# Patient Record
Sex: Female | Born: 1980 | Race: Black or African American | Hispanic: No | Marital: Single | Smoking: Never smoker
Health system: Southern US, Community
[De-identification: ages and names within clinical notes are randomized; demographics above are authoritative.]

## PROBLEM LIST (undated history)

## (undated) HISTORY — PX: OTHER SURGICAL HISTORY: SHX169

---

## 1999-11-03 ENCOUNTER — Emergency Department (HOSPITAL_COMMUNITY): Admission: EM | Admit: 1999-11-03 | Discharge: 1999-11-03 | Payer: Self-pay | Admitting: Emergency Medicine

## 1999-11-03 ENCOUNTER — Encounter: Payer: Self-pay | Admitting: Emergency Medicine

## 2001-06-17 ENCOUNTER — Emergency Department (HOSPITAL_COMMUNITY): Admission: EM | Admit: 2001-06-17 | Discharge: 2001-06-17 | Payer: Self-pay | Admitting: Emergency Medicine

## 2001-06-17 ENCOUNTER — Encounter: Payer: Self-pay | Admitting: Emergency Medicine

## 2002-03-22 ENCOUNTER — Emergency Department (HOSPITAL_COMMUNITY): Admission: EM | Admit: 2002-03-22 | Discharge: 2002-03-22 | Payer: Self-pay | Admitting: Emergency Medicine

## 2003-07-10 ENCOUNTER — Emergency Department (HOSPITAL_COMMUNITY): Admission: EM | Admit: 2003-07-10 | Discharge: 2003-07-10 | Payer: Self-pay | Admitting: Emergency Medicine

## 2005-03-13 ENCOUNTER — Other Ambulatory Visit: Admission: RE | Admit: 2005-03-13 | Discharge: 2005-03-13 | Payer: Self-pay | Admitting: Gynecology

## 2005-03-14 ENCOUNTER — Encounter: Admission: RE | Admit: 2005-03-14 | Discharge: 2005-03-14 | Payer: Self-pay | Admitting: Gynecology

## 2005-06-20 ENCOUNTER — Emergency Department (HOSPITAL_COMMUNITY): Admission: EM | Admit: 2005-06-20 | Discharge: 2005-06-20 | Payer: Self-pay | Admitting: Emergency Medicine

## 2005-06-28 ENCOUNTER — Encounter: Admission: RE | Admit: 2005-06-28 | Discharge: 2005-06-28 | Payer: Self-pay | Admitting: Specialist

## 2006-02-12 ENCOUNTER — Other Ambulatory Visit: Admission: RE | Admit: 2006-02-12 | Discharge: 2006-02-12 | Payer: Self-pay | Admitting: Gynecology

## 2006-05-30 ENCOUNTER — Emergency Department (HOSPITAL_COMMUNITY): Admission: EM | Admit: 2006-05-30 | Discharge: 2006-05-30 | Payer: Self-pay | Admitting: Emergency Medicine

## 2007-02-18 ENCOUNTER — Other Ambulatory Visit: Admission: RE | Admit: 2007-02-18 | Discharge: 2007-02-18 | Payer: Self-pay | Admitting: Gynecology

## 2007-08-27 ENCOUNTER — Other Ambulatory Visit: Admission: RE | Admit: 2007-08-27 | Discharge: 2007-08-27 | Payer: Self-pay | Admitting: Gynecology

## 2008-02-21 ENCOUNTER — Emergency Department (HOSPITAL_COMMUNITY): Admission: EM | Admit: 2008-02-21 | Discharge: 2008-02-22 | Payer: Self-pay | Admitting: Emergency Medicine

## 2008-06-10 ENCOUNTER — Emergency Department (HOSPITAL_COMMUNITY): Admission: EM | Admit: 2008-06-10 | Discharge: 2008-06-10 | Payer: Self-pay | Admitting: Emergency Medicine

## 2009-03-06 ENCOUNTER — Emergency Department (HOSPITAL_COMMUNITY): Admission: EM | Admit: 2009-03-06 | Discharge: 2009-03-06 | Payer: Self-pay | Admitting: Emergency Medicine

## 2010-05-28 ENCOUNTER — Emergency Department (HOSPITAL_COMMUNITY)
Admission: EM | Admit: 2010-05-28 | Discharge: 2010-05-28 | Disposition: A | Discharge status: Home / Self Care | Payer: No Typology Code available for payment source | Attending: Emergency Medicine | Admitting: Emergency Medicine

## 2010-05-28 DIAGNOSIS — IMO0001 Reserved for inherently not codable concepts without codable children: Secondary | ICD-10-CM | POA: Insufficient documentation

## 2010-05-28 DIAGNOSIS — R509 Fever, unspecified: Secondary | ICD-10-CM | POA: Insufficient documentation

## 2010-05-28 DIAGNOSIS — R059 Cough, unspecified: Secondary | ICD-10-CM | POA: Insufficient documentation

## 2010-05-28 DIAGNOSIS — R5383 Other fatigue: Secondary | ICD-10-CM | POA: Insufficient documentation

## 2010-05-28 DIAGNOSIS — R5381 Other malaise: Secondary | ICD-10-CM | POA: Insufficient documentation

## 2010-05-28 DIAGNOSIS — R05 Cough: Secondary | ICD-10-CM | POA: Insufficient documentation

## 2010-05-28 DIAGNOSIS — B9789 Other viral agents as the cause of diseases classified elsewhere: Secondary | ICD-10-CM | POA: Insufficient documentation

## 2010-05-28 LAB — URINALYSIS, ROUTINE W REFLEX MICROSCOPIC
Glucose, UA: NEGATIVE mg/dL
pH: 7.5 (ref 5.0–8.0)

## 2010-05-28 LAB — URINE MICROSCOPIC-ADD ON

## 2012-01-28 ENCOUNTER — Encounter (HOSPITAL_COMMUNITY): Payer: Self-pay | Admitting: *Deleted

## 2012-01-28 ENCOUNTER — Emergency Department (HOSPITAL_COMMUNITY)
Admission: EM | Admit: 2012-01-28 | Discharge: 2012-01-28 | Disposition: A | Discharge status: Home / Self Care | Payer: Self-pay | Attending: Emergency Medicine | Admitting: Emergency Medicine

## 2012-01-28 ENCOUNTER — Ambulatory Visit (HOSPITAL_COMMUNITY): Payer: Self-pay

## 2012-01-28 DIAGNOSIS — R51 Headache: Secondary | ICD-10-CM

## 2012-01-28 NOTE — ED Provider Notes (Signed)
History     CSN: [id number]  Arrival date & time 01/28/12  1242   First MD Initiated Contact with Patient 01/28/12 1356      Chief Complaint  Patient presents with  . Headache    (Consider location/radiation/quality/duration/timing/severity/associated sxs/prior treatment) HPI Comments: Patient presents today with a chief complaint of headache.  She reports that the headache has been intermittent for the past 4 days.  Headache located at the posterior portion of her head.  She has been taking Ibuprofen for the pain, which helps somewhat.  No prior history of migraines.  She reports that she does not typically have headaches.  She reports that she has never had a headache like before.  She is concerned that she may have an aneurysm.   She denies nausea, vomiting, or vision changes.  No neck pain or stiffness.    Patient is a 31 y.o. female presenting with headaches. The history is provided by the patient.  Headache  The headache is associated with nothing. The pain is located in the occipital region. The quality of the pain is described as throbbing. The pain does not radiate. Pertinent negatives include no fever, no near-syncope, no syncope, no nausea and no vomiting.    History reviewed. No pertinent past medical history.  History reviewed. No pertinent past surgical history.  History reviewed. No pertinent family history.  History  Substance Use Topics  . Smoking status: Never Smoker   . Smokeless tobacco: Not on file  . Alcohol Use: No    OB History    Grav Para Term Preterm Abortions TAB SAB Ect Mult Living                  Review of Systems  Constitutional: Negative for fever and chills.  HENT: Negative for neck pain and neck stiffness.   Eyes: Negative for visual disturbance.  Cardiovascular: Negative for syncope and near-syncope.  Gastrointestinal: Negative for nausea and vomiting.  Skin: Negative for wound.  Neurological: Positive for headaches. Negative for  dizziness, seizures, syncope, weakness, light-headedness and numbness.  Hematological: Does not bruise/bleed easily.  Psychiatric/Behavioral: Negative for confusion.    Allergies  Review of patient's allergies indicates no known allergies.  Home Medications   Current Outpatient Rx  Name  Route  Sig  Dispense  Refill  . IBUPROFEN 200 MG PO TABS   Oral   Take 200 mg by mouth every 6 (six) hours as needed. Pain           BP 103/88  Pulse 99  Temp 98.2 F (36.8 C) (Oral)  Resp 16  SpO2 100%  Physical Exam  Nursing note and vitals reviewed. Constitutional: She appears well-developed and well-nourished. No distress.  HENT:  Head: Normocephalic and atraumatic.  Mouth/Throat: Oropharynx is clear and moist.  Eyes: EOM are normal. Pupils are equal, round, and reactive to light.  Neck: Normal range of motion. Neck supple.  Cardiovascular: Normal rate, regular rhythm and normal heart sounds.   Pulmonary/Chest: Effort normal and breath sounds normal.  Neurological: She is alert. She has normal strength. No cranial nerve deficit or sensory deficit. Coordination and gait normal.  Skin: Skin is warm and dry. She is not diaphoretic.    ED Course  Procedures (including critical care time)  Labs Reviewed - No data to display Ct Head Wo Contrast  01/28/2012  *RADIOLOGY REPORT*  Clinical Data: Headache for 4 days  CT HEAD WITHOUT CONTRAST  Technique:  Contiguous axial images were obtained from  the base of the skull through the vertex without contrast.  Comparison: None.  Findings: The brain has a normal appearance without evidence of malformation, atrophy, old or acute infarction, mass lesion, hemorrhage, hydrocephalus or extra-axial collection.  The calvarium is unremarkable.  Visualized sinuses, middle ears and mastoids are clear.  IMPRESSION: Normal head CT   Original Report Authenticated By: Paulina Fusi, M.D.      1. Headache       MDM  Patient presenting with a chief  complaint of headache.  She reports that she doesn't normally have headaches and has never had a headache like this before.  Patient is afebrile.  No stiffness of the neck.  Normal neurological exam.  Head CT negative.  Therefore, feel that patient can be discharged home.  Patient instructed to follow up with PCP.        Pascal Lux [place], PA-C 01/28/12 [phone]

## 2012-01-28 NOTE — ED Notes (Signed)
Pt reports headache x3-4 days. Pain radiates from back of neck up back of head. Denies blurry vision or dizziness. Reports intermittent relief of pain from iburprofen, but pain returns.

## 2012-01-29 NOTE — ED Provider Notes (Signed)
Medical screening examination/treatment/procedure(s) were performed by non-physician practitioner and as supervising physician I was immediately available for consultation/collaboration.  Toy Baker, MD 01/29/12 [phone]

## 2014-04-09 IMAGING — CT CT HEAD W/O CM
2 series · 16 of 30 positions shown, 20 images · non-contrast
Comparison: None.

CLINICAL DATA: Headache for 4 days

CT HEAD WITHOUT CONTRAST
TECHNIQUE: Contiguous axial images were obtained from the base of
the skull through the vertex without contrast.

[Series 2: head w/o · axial · non-contrast · 0.43mm/px · z∈[-163,-43]mm · 13 of 29 slices shown, 17 images]
[im 3/29  brain]
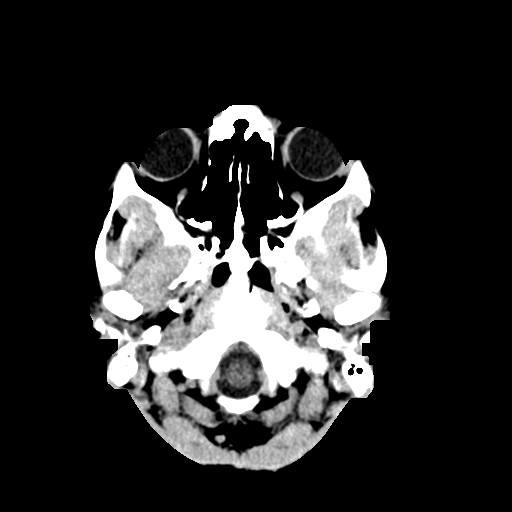
[im 3/29  bone]
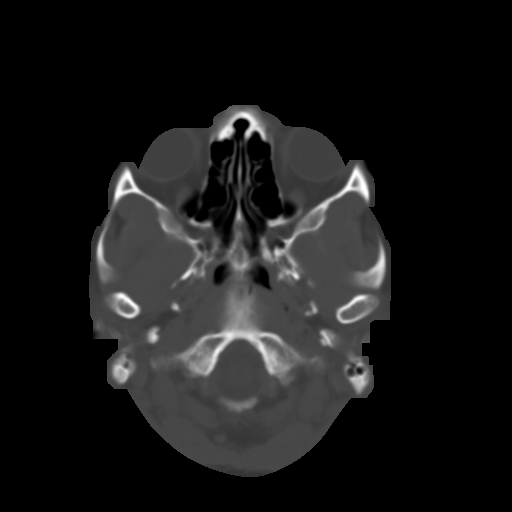
[im 5/29  brain]
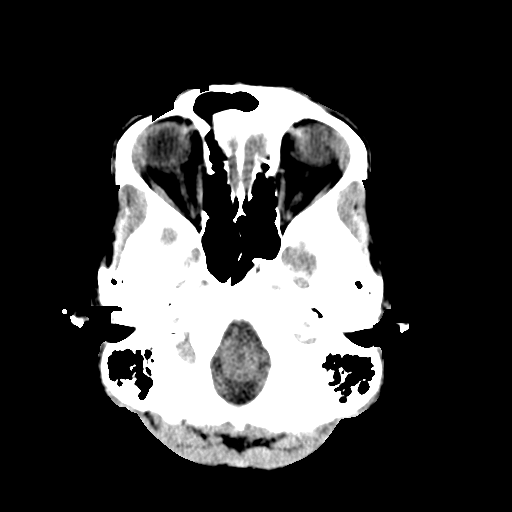
[im 7/29  brain]
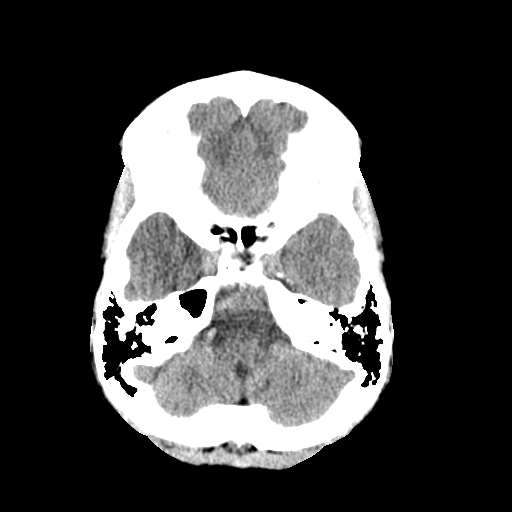
[im 9/29  brain]
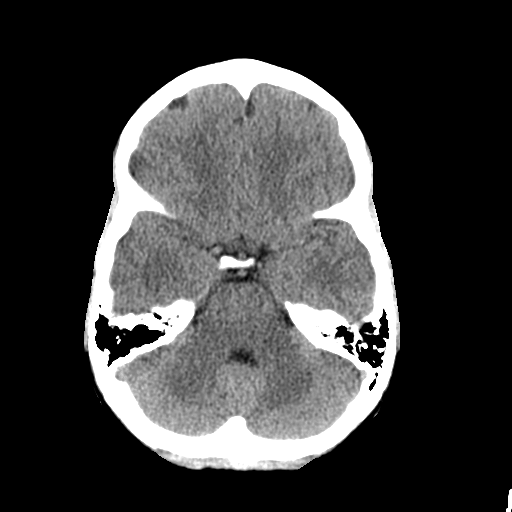
[im 11/29  brain]
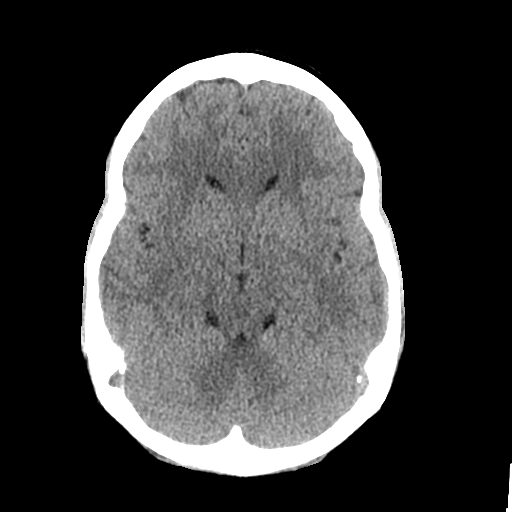
[im 11/29  bone]
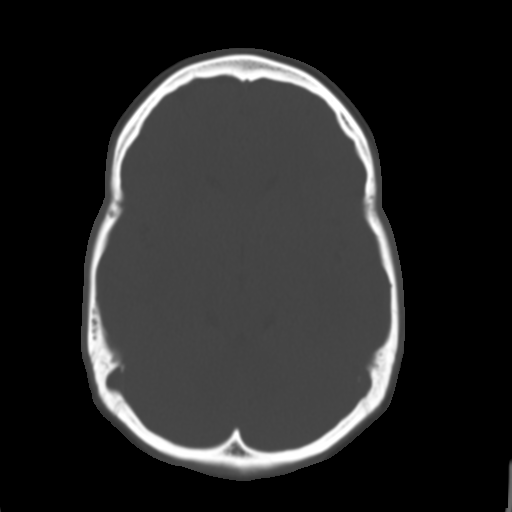
[im 13/29  brain]
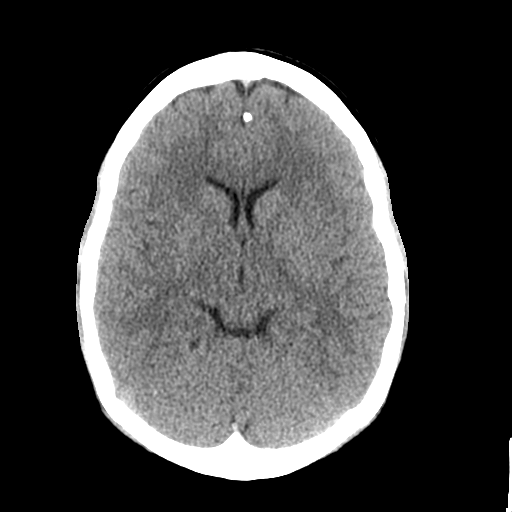
[im 15/29  brain]
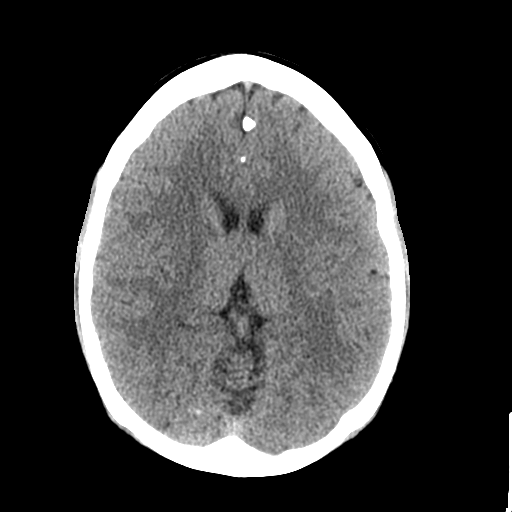
[im 17/29  brain]
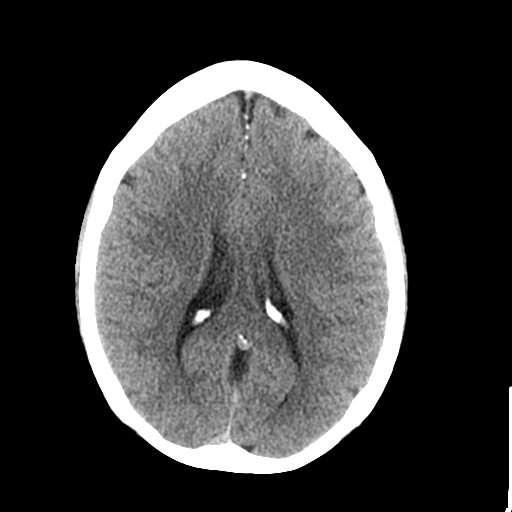
[im 19/29  brain]
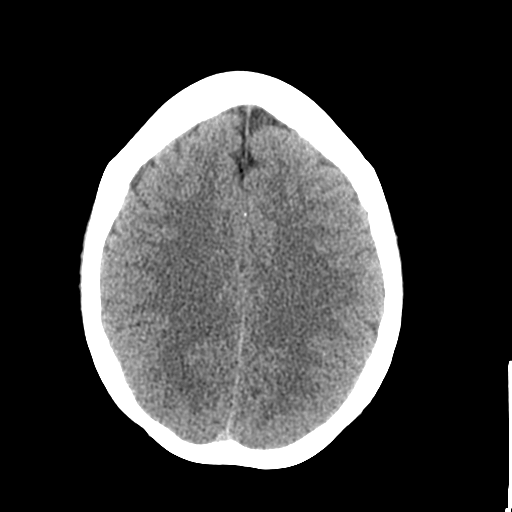
[im 19/29  bone]
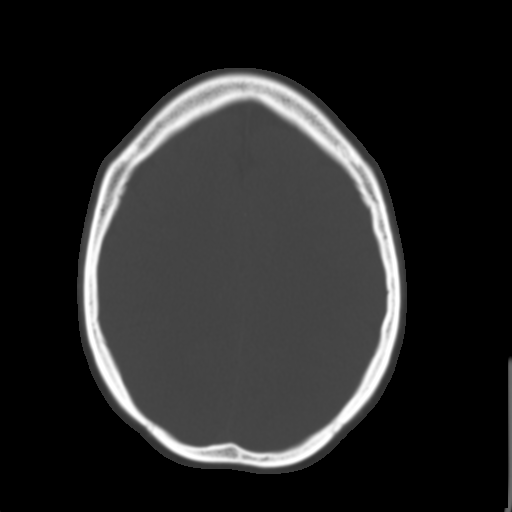
[im 21/29  brain]
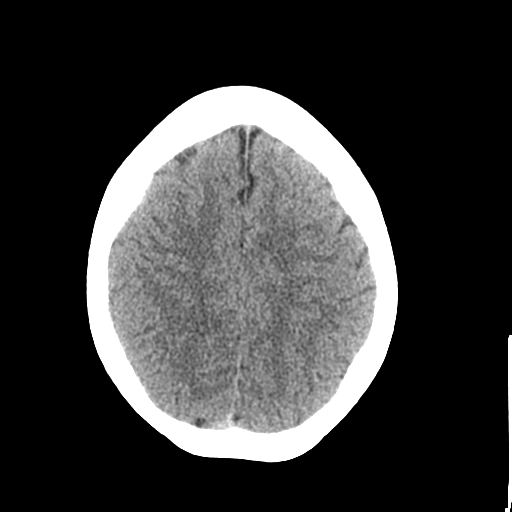
[im 23/29  brain]
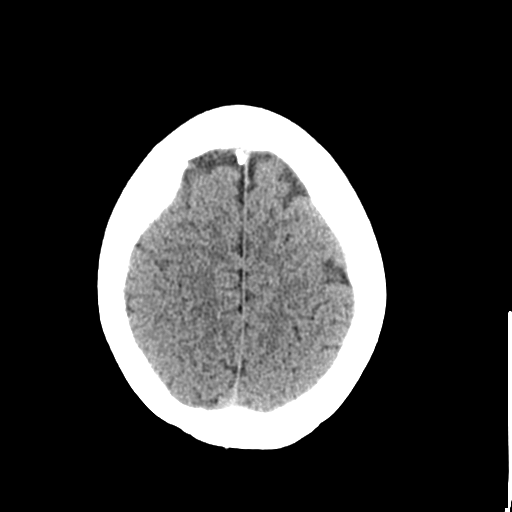
[im 25/29  brain]
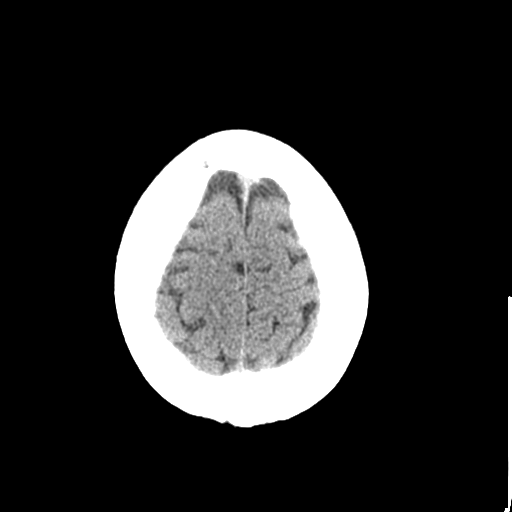
[im 27/29  brain]
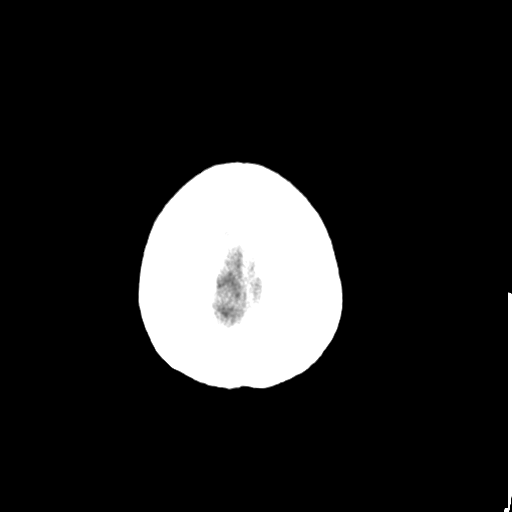
[im 27/29  bone]
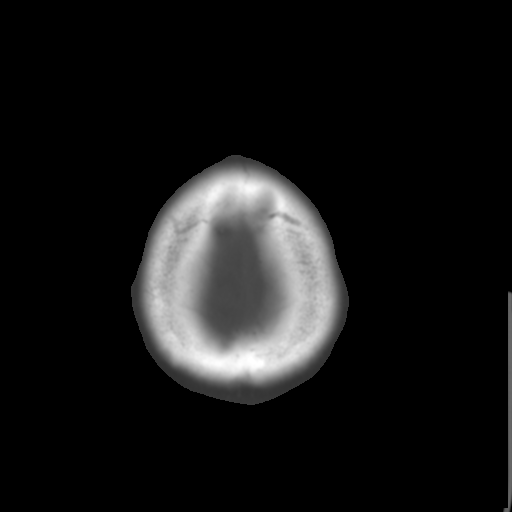

[Series 3: bone windows · axial · 0.43mm/px · z∈[-163,-123]mm · 3 of 29 slices shown]
[im 3/29  bone]
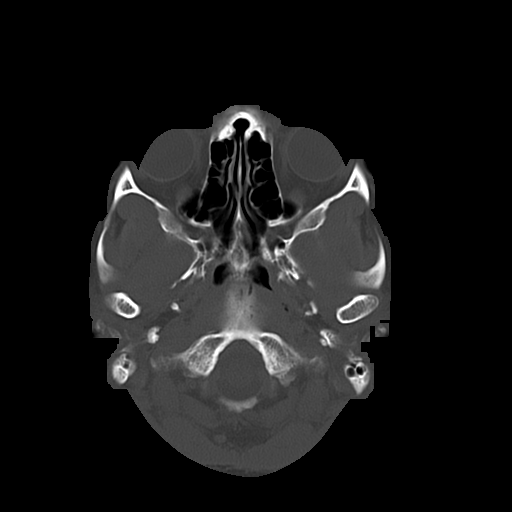
[im 7/29  bone]
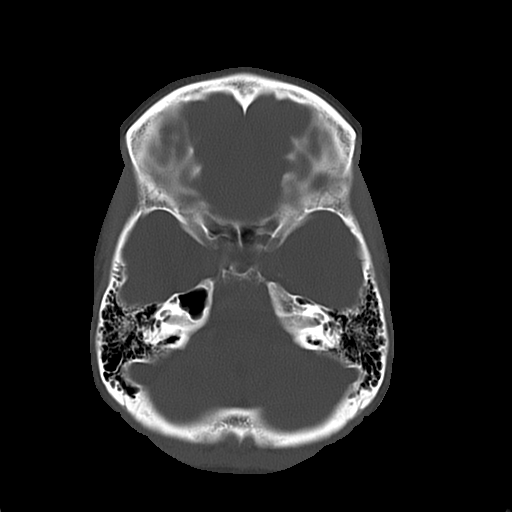
[im 11/29  bone]
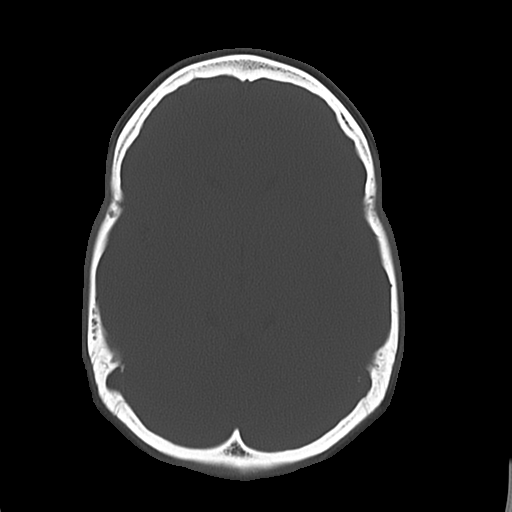

[16 of 30 positions shown; findings below may reference images not displayed]

FINDINGS: The brain has a normal appearance without evidence of
malformation, atrophy, old or acute infarction, mass lesion,
hemorrhage, hydrocephalus or extra-axial collection.  The calvarium
is unremarkable.  Visualized sinuses, middle ears and mastoids are
clear.
IMPRESSION: Normal head CT

## 2014-12-27 ENCOUNTER — Emergency Department (HOSPITAL_COMMUNITY)
Admission: EM | Admit: 2014-12-27 | Discharge: 2014-12-27 | Disposition: A | Discharge status: Home / Self Care | Payer: 59 | Attending: Emergency Medicine | Admitting: Emergency Medicine

## 2014-12-27 ENCOUNTER — Encounter (HOSPITAL_COMMUNITY): Payer: Self-pay | Admitting: Emergency Medicine

## 2014-12-27 DIAGNOSIS — R52 Pain, unspecified: Secondary | ICD-10-CM | POA: Diagnosis not present

## 2014-12-27 DIAGNOSIS — R42 Dizziness and giddiness: Secondary | ICD-10-CM | POA: Insufficient documentation

## 2014-12-27 DIAGNOSIS — R6883 Chills (without fever): Secondary | ICD-10-CM | POA: Diagnosis not present

## 2014-12-27 DIAGNOSIS — Z3202 Encounter for pregnancy test, result negative: Secondary | ICD-10-CM | POA: Insufficient documentation

## 2014-12-27 DIAGNOSIS — R197 Diarrhea, unspecified: Secondary | ICD-10-CM | POA: Diagnosis not present

## 2014-12-27 LAB — URINE MICROSCOPIC-ADD ON

## 2014-12-27 LAB — CBC WITH DIFFERENTIAL/PLATELET
BASOS ABS: 0 10*3/uL (ref 0.0–0.1)
BASOS PCT: 0 %
Eosinophils Absolute: 0 10*3/uL (ref 0.0–0.7)
Eosinophils Relative: 0 %
HEMATOCRIT: 45.4 % (ref 36.0–46.0)
Hemoglobin: 15.1 g/dL — ABNORMAL HIGH (ref 12.0–15.0)
Lymphocytes Relative: 5 %
Lymphs Abs: 0.6 10*3/uL — ABNORMAL LOW (ref 0.7–4.0)
MCH: 28.8 pg (ref 26.0–34.0)
MCHC: 33.3 g/dL (ref 30.0–36.0)
MCV: 86.6 fL (ref 78.0–100.0)
MONO ABS: 0.4 10*3/uL (ref 0.1–1.0)
Monocytes Relative: 4 %
NEUTROS ABS: 9.5 10*3/uL — AB (ref 1.7–7.7)
Neutrophils Relative %: 91 %
PLATELETS: 262 10*3/uL (ref 150–400)
RBC: 5.24 MIL/uL — ABNORMAL HIGH (ref 3.87–5.11)
RDW: 13.8 % (ref 11.5–15.5)
WBC: 10.5 10*3/uL (ref 4.0–10.5)

## 2014-12-27 LAB — COMPREHENSIVE METABOLIC PANEL
ALBUMIN: 4.5 g/dL (ref 3.5–5.0)
ALT: 21 U/L (ref 14–54)
ANION GAP: 10 (ref 5–15)
AST: 17 U/L (ref 15–41)
Alkaline Phosphatase: 113 U/L (ref 38–126)
BUN: 6 mg/dL (ref 6–20)
CALCIUM: 9.5 mg/dL (ref 8.9–10.3)
CHLORIDE: 105 mmol/L (ref 101–111)
CO2: 23 mmol/L (ref 22–32)
Creatinine, Ser: 1.09 mg/dL — ABNORMAL HIGH (ref 0.44–1.00)
GFR calc non Af Amer: >60 mL/min (ref >60)
Glucose, Bld: 93 mg/dL (ref 65–99)
Potassium: 4.1 mmol/L (ref 3.5–5.1)
Sodium: 138 mmol/L (ref 135–145)
Total Bilirubin: 0.8 mg/dL (ref 0.3–1.2)
Total Protein: 8.9 g/dL — ABNORMAL HIGH (ref 6.5–8.1)

## 2014-12-27 LAB — URINALYSIS, ROUTINE W REFLEX MICROSCOPIC
Glucose, UA: NEGATIVE mg/dL
Ketones, ur: 15 mg/dL — AB
NITRITE: NEGATIVE
PROTEIN: NEGATIVE mg/dL
SPECIFIC GRAVITY, URINE: 1.024 (ref 1.005–1.030)
pH: 5.5 (ref 5.0–8.0)

## 2014-12-27 LAB — LIPASE, BLOOD: Lipase: 33 U/L (ref 11–51)

## 2014-12-27 LAB — POC URINE PREG, ED: PREG TEST UR: NEGATIVE

## 2014-12-27 MED ORDER — ACETAMINOPHEN 325 MG PO TABS
650.0000 mg | ORAL_TABLET | Freq: Once | ORAL | Status: AC
  Administered 2014-12-27: 650 mg via ORAL
  Filled 2014-12-27: qty 2

## 2014-12-27 MED ORDER — IBUPROFEN 600 MG PO TABS: 600.0000 mg | ORAL_TABLET | Freq: Four times a day (QID) | ORAL | Status: DC | PRN

## 2014-12-27 MED ORDER — SODIUM CHLORIDE 0.9 % IV BOLUS (SEPSIS)
1000.0000 mL | Freq: Once | INTRAVENOUS | Status: AC
  Administered 2014-12-27: 1000 mL via INTRAVENOUS

## 2014-12-27 NOTE — ED Notes (Signed)
Attempted to get a urinalysis.

## 2014-12-27 NOTE — ED Notes (Signed)
Pt sts generalized body aches and diarrhea starting today

## 2014-12-27 NOTE — ED Provider Notes (Signed)
CSN: [id number]     Arrival date & time 12/27/14  1411 History   First MD Initiated Contact with Patient 12/27/14 1833     Chief Complaint  Patient presents with  . Generalized Body Aches    HPI   Vicki Contreras is a 34 y.o. female with no pertinent PMH who presents to the ED with generalized body aches and diarrhea, which she states started this morning. She denies exacerbating factors. She has not tried anything for symptom relief. She denies fever, though reports chills. She denies chest pain, shortness of breath, abdominal pain, nausea, vomiting, hematochezia, melena, dysuria, urgency, frequency. She reports asociated dizziness, now resolved, and decreased appetite throughout the day today. She denies recent travel or antibiotic use.    History reviewed. No pertinent past medical history. History reviewed. No pertinent past surgical history. History reviewed. No pertinent family history. Social History  Substance Use Topics  . Smoking status: Never Smoker   . Smokeless tobacco: None  . Alcohol Use: No   OB History    No data available      Review of Systems  Constitutional: Positive for chills. Negative for fever.  Respiratory: Negative for shortness of breath.   Cardiovascular: Negative for chest pain.  Gastrointestinal: Positive for diarrhea. Negative for nausea, vomiting, abdominal pain and constipation.  Genitourinary: Negative for dysuria, urgency and frequency.  Neurological: Positive for dizziness. Negative for weakness, light-headedness, numbness and headaches.  All other systems reviewed and are negative.     Allergies  Review of patient's allergies indicates no known allergies.  Home Medications   Prior to Admission medications   Medication Sig Start Date End Date Taking? Authorizing Provider  Aspirin-Acetaminophen-Caffeine (GOODY HEADACHE PO) Take 1 packet by mouth daily as needed. For headache   Yes Historical Provider, MD  ibuprofen (ADVIL,MOTRIN) 600  MG tablet Take 1 tablet (600 mg total) by mouth every 6 (six) hours as needed. 12/27/14   Bethaney Oshana C Rossetta Kama, PA-C    BP 104/65 mmHg  Pulse 92  Temp(Src) 99.4 F (37.4 C) (Oral)  Resp 14  SpO2 100% Physical Exam  Constitutional: She is oriented to person, place, and time. She appears well-developed and well-nourished. No distress.  HENT:  Head: Normocephalic and atraumatic.  Right Ear: External ear normal.  Left Ear: External ear normal.  Nose: Nose normal.  Mouth/Throat: Uvula is midline, oropharynx is clear and moist and mucous membranes are normal.  Eyes: Conjunctivae, EOM and lids are normal. Pupils are equal, round, and reactive to light. Right eye exhibits no discharge. Left eye exhibits no discharge. No scleral icterus.  Neck: Normal range of motion. Neck supple.  Cardiovascular: Normal rate, regular rhythm, normal heart sounds, intact distal pulses and normal pulses.   Pulmonary/Chest: Effort normal and breath sounds normal. No respiratory distress. She has no wheezes. She has no rales.  Abdominal: Soft. Normal appearance and bowel sounds are normal. She exhibits no distension and no mass. There is no tenderness. There is no rigidity, no rebound and no guarding.  Musculoskeletal: Normal range of motion. She exhibits no edema or tenderness.  Neurological: She is alert and oriented to person, place, and time. She has normal strength. No sensory deficit.  Skin: Skin is warm, dry and intact. No rash noted. She is not diaphoretic. No erythema. No pallor.  Psychiatric: She has a normal mood and affect. Her speech is normal and behavior is normal.  Nursing note and vitals reviewed.   ED Course  Procedures (including critical care  time)  Labs Review Labs Reviewed  CBC WITH DIFFERENTIAL/PLATELET - Abnormal; Notable for the following:    RBC 5.24 (*)    Hemoglobin 15.1 (*)    Neutro Abs 9.5 (*)    Lymphs Abs 0.6 (*)    All other components within normal limits  COMPREHENSIVE  METABOLIC PANEL - Abnormal; Notable for the following:    Creatinine, Ser 1.09 (*)    Total Protein 8.9 (*)    All other components within normal limits  URINALYSIS, ROUTINE W REFLEX MICROSCOPIC (NOT [facility]) - Abnormal; Notable for the following:    Color, Urine AMBER (*)    APPearance HAZY (*)    Hgb urine dipstick TRACE (*)    Bilirubin Urine SMALL (*)    Ketones, ur 15 (*)    Leukocytes, UA MODERATE (*)    All other components within normal limits  URINE MICROSCOPIC-ADD ON - Abnormal; Notable for the following:    Squamous Epithelial / LPF 6-30 (*)    Bacteria, UA FEW (*)    All other components within normal limits  URINE CULTURE  LIPASE, BLOOD  POC URINE PREG, ED    Imaging Review No results found.   I have personally reviewed and evaluated these lab results as part of my medical decision-making.   EKG Interpretation None      MDM   Final diagnoses:  Body aches  Diarrhea, unspecified type    34 year old feel presents with generalized body aches and diarrhea, which started today. Denies fever, though reports chills. Denies chest pain, shortness of breath, abdominal pain, nausea, vomiting, hematochezia, melena, dysuria, urgency, frequency. Reports asociated dizziness, now resolved, and decreased appetite throughout the day today. Denies recent travel or antibiotic use.   Patient is afebrile. Mildly tachycardic. Heart regular rhythm. Lungs clear to auscultation bilaterally. Abdomen soft, nontender, nondistended. No rebound, guarding, or masses. Patient moves all extremities without difficulty.  Labs as above pending. Will give tylenol and fluids.  Urine pregnancy negative. UA with moderate leukocytes, 6-30 WBC, 6-30 squamous epithelial cells, few bacteria. Patient denies dysuria, urgency, frequency, do not feel treatment for UTI is indicated at this time. Will culture urine. CBC negative for leukocytosis or anemia. CMP remarkable for creatinine 1.09. Lipase within  normal limits.  Patient is nontoxic and well-appearing. HR improved with fluids. Symptoms most likely viral. Feel she is stable for discharge at this time. Patient to follow up with PCP, resource list given. Return precautions discussed. Patient verbalizes her understanding and is in agreement with plan.  BP 104/65 mmHg  Pulse 92  Temp(Src) 99.4 F (37.4 C) (Oral)  Resp 14  SpO2 100%      Mady Gemmalizabeth C Alyse Kathan, PA-C 12/28/14 1002  Mancel BaleElliott Wentz, MD 12/28/14 1037

## 2014-12-27 NOTE — Discharge Instructions (Signed)
1. Medications: motrin, usual home medications 2. Treatment: rest, drink plenty of fluids  3. Follow Up: please followup with your primary doctor for discussion of your diagnoses and further evaluation after today's visit; if you do not have a primary care doctor use the resource guide provided to find one; please return to the ER for high fever, abdominal pain, persistent vomiting, bloody diarrhea, new or worsening symptoms   Diarrhea Diarrhea is frequent loose and watery bowel movements. It can cause you to feel weak and dehydrated. Dehydration can cause you to become tired and thirsty, have a dry mouth, and have decreased urination that often is dark yellow. Diarrhea is a sign of another problem, most often an infection that will not last long. In most cases, diarrhea typically lasts 2-3 days. However, it can last longer if it is a sign of something more serious. It is important to treat your diarrhea as directed by your caregiver to lessen or prevent future episodes of diarrhea. CAUSES  Some common causes include:  Gastrointestinal infections caused by viruses, bacteria, or parasites.  Food poisoning or food allergies.  Certain medicines, such as antibiotics, chemotherapy, and laxatives.  Artificial sweeteners and fructose.  Digestive disorders. HOME CARE INSTRUCTIONS  Ensure adequate fluid intake (hydration): Have 1 cup (8 oz) of fluid for each diarrhea episode. Avoid fluids that contain simple sugars or sports drinks, fruit juices, whole milk products, and sodas. Your urine should be clear or pale yellow if you are drinking enough fluids. Hydrate with an oral rehydration solution that you can purchase at pharmacies, retail stores, and online. You can prepare an oral rehydration solution at home by mixing the following ingredients together:   - tsp table salt.   tsp baking soda.   tsp salt substitute containing potassium chloride.  1  tablespoons sugar.  1 L (34 oz) of  water.  Certain foods and beverages may increase the speed at which food moves through the gastrointestinal (GI) tract. These foods and beverages should be avoided and include:  Caffeinated and alcoholic beverages.  High-fiber foods, such as raw fruits and vegetables, nuts, seeds, and whole grain breads and cereals.  Foods and beverages sweetened with sugar alcohols, such as xylitol, sorbitol, and mannitol.  Some foods may be well tolerated and may help thicken stool including:  Starchy foods, such as rice, toast, pasta, low-sugar cereal, oatmeal, grits, baked potatoes, crackers, and bagels.  Bananas.  Applesauce.  Add probiotic-rich foods to help increase healthy bacteria in the GI tract, such as yogurt and fermented milk products.  Wash your hands well after each diarrhea episode.  Only take over-the-counter or prescription medicines as directed by your caregiver.  Take a warm bath to relieve any burning or pain from frequent diarrhea episodes. SEEK IMMEDIATE MEDICAL CARE IF:   You are unable to keep fluids down.  You have persistent vomiting.  You have blood in your stool, or your stools are black and tarry.  You do not urinate in 6-8 hours, or there is only a small amount of very dark urine.  You have abdominal pain that increases or localizes.  You have weakness, dizziness, confusion, or light-headedness.  You have a severe headache.  Your diarrhea gets worse or does not get better.  You have a fever or persistent symptoms for more than 2-3 days.  You have a fever and your symptoms suddenly get worse. MAKE SURE YOU:   Understand these instructions.  Will watch your condition.  Will get help right  away if you are not doing well or get worse.   This information is not intended to replace advice given to you by your health care provider. Make sure you discuss any questions you have with your health care provider.   Document Released: 01/04/2002 Document  Revised: 02/04/2014 Document Reviewed: 09/22/2011 Elsevier Interactive Patient Education 2016 [employer].   Emergency Department Resource Guide 1) Find a Doctor and Pay Out of Pocket Although you won't have to find out who is covered by your insurance plan, it is a good idea to ask around and get recommendations. You will then need to call the office and see if the doctor you have chosen will accept you as a new patient and what types of options they offer for patients who are self-pay. Some doctors offer discounts or will set up payment plans for their patients who do not have insurance, but you will need to ask so you aren't surprised when you get to your appointment.  2) Contact Your Local Health Department Not all health departments have doctors that can see patients for sick visits, but many do, so it is worth a call to see if yours does. If you don't know where your local health department is, you can check in your phone book. The CDC also has a tool to help you locate your state's health department, and many state websites also have listings of all of their local health departments.  3) Find a Walk-in Clinic If your illness is not likely to be very severe or complicated, you may want to try a walk in clinic. These are popping up all over the country in pharmacies, drugstores, and shopping centers. They're usually staffed by nurse practitioners or physician assistants that have been trained to treat common illnesses and complaints. They're usually fairly quick and inexpensive. However, if you have serious medical issues or chronic medical problems, these are probably not your best option.  No Primary Care Doctor: - Call Health Connect at  [phone] - they can help you locate a primary care doctor that  accepts your insurance, provides certain services, etc. - Physician Referral Service- [phone]  Chronic Pain Problems: Organization         Address  Phone   Notes  [facility]  [phone] Patients need to be referred by their primary care doctor.   Medication Assistance: Organization         Address  Phone   Notes  [facility] Medication [facility] [address] [place] [place], [place] [address] [phone] --Must be a resident of [facility] -- Must have NO insurance coverage whatsoever (no Medicaid/ Medicare, etc.) -- The pt. MUST have a primary care doctor that directs their care regularly and follows them in the community   [facility]  [phone]   [employer]  [phone]    Agencies that provide inexpensive medical care: Organization         Address  Phone   Notes  [facility]  [phone]   [facility]    [phone]   [facility] [address] [place], [place] [address] [phone]   [facility] [address] [place], [place] [phone]   Planned Parenthood    [phone]   [facility]    [phone]   [facility]  [address] [place], [place] Phone:  [phone]  [phone], Fax:  [phone] Hours of Operation:  9 am - 6 pm, M-F.  Also accepts Medicaid/Medicare and self-pay.  [facility]  [address], [place] Phone: [phone], Fax: [phone]. Hours of Operation:  8:30 am - 5:30 pm, M-F.  Also accepts Medicaid and self-pay.  [facility] [place] [address], [place] Phone: [phone]   [facility] [address] Natasha Bence [place], [place] [phone], Ext. [phone] Mondays & Thursdays: 7-9 AM.  First 15 patients are seen on a first come, first serve basis.    Medicaid-accepting [facility] Providers:  Organization         Address  Phone   Notes  [facility] (2-Rh) [address], [place] [phone] Also accepts self-pay patients.  [facility] [address] Laurell Josephs  [place], [place]  [phone]   [facility] [address], [place] [phone]   [facility] [address], [place] [phone]   Renaye Rakers [address], [place]   [phone] Only accepts [place] Access [place] patients after they have their name applied to their card.   Self-Pay (no insurance) in [facility]:  Organization         Address  Phone   Notes  Sickle Cell Patients, [facility] Internal Medicine [address] [place], [place] [phone]   [facility] Urgent Care [address] [place], [place] [phone]   [facility] [place]  [address], [place] [phone]   [facility]/Dr. Osei-Bonsu  [address] [place] or [address], [place] [phone] Phone number for both [place] and [place] locations is the same.  [facility] [address] [place] [phone]   [facility] [address], [place] or [address] [phone]   [facility] [address], [place] [phone], phone; [phone], fax Sees patients 1st and 3rd Saturday of every month.  Must not qualify for public or private insurance (i.e. Medicaid, Medicare, [place] Health Choice, Veterans' Benefits)  Household income should be no more than 200% of the poverty level The clinic cannot treat you if you are pregnant or think you are pregnant  Sexually transmitted diseases are not treated at the clinic.    Dental Care: Organization         Address  Phone  Notes  [facility] Department of [facility] [address] [place], [place] [phone] Accepts children up to age 44 who are enrolled in [place] or [place] Health Choice; pregnant women with a Medicaid card; and children who have applied for Medicaid or [place]  Health Choice, but were declined, whose parents can pay a reduced fee at time of service.  [facility]  [address], [place] [phone] Accepts children up to age 4 who are enrolled in [place] or [place] Health Choice; pregnant women with a Medicaid card; and children who have applied for Medicaid or [place] Health Choice, but were declined, whose parents can pay a reduced fee at time of service.  [facility]  [address] [place], [place] [phone] Patients are seen by appointment only. Walk-ins are  not accepted. [facility] will see patients 57 years of age and older. Monday - Tuesday (8am-5pm) Most Wednesdays (8:30-5pm) $30 per visit, cash only  [facility] Adult Dental Access PRO[facility]  [address], [facility] [phone] Patients are seen by appointment only. Walk-ins are not accepted. [facility] will see patients 34 years of age and older. One Wednesday Evening (Monthly: Volunteer Based).  $30 per visit, cash only  [employer]  [phone] for adults; Children under age 59, call Graduate Pediatric Dentistry at [phone]. Children aged 65-14, please call [phone] to request a pediatric application.  Dental services are provided in all areas of dental care including fillings, crowns and bridges, complete and partial dentures, implants, gum treatment, root canals, and extractions. Preventive care is also provided. Treatment is provided to both adults and children. Patients are selected via a lottery and there is often a waiting list.   [facility] [address], [place]  [phone] [email or web address]   [facility] [address] [place], [place] [phone], Ext. [phone] Second and Fourth Thursday of each month, opens at 6:30 AM; Clinic ends at 9 AM.  Patients are seen on a first-come first-served basis, and a limited number are seen during  each clinic.   [facility]  [address] [place], [place] [phone]   Eligibility Requirements You must have lived in [place], [place], or [place] counties for at least the last three months.   You cannot be eligible for state or federal sponsored [place], including [employer], [place], or [employer].   You generally cannot be eligible for healthcare insurance through your employer.    How to apply: Eligibility screenings are held every Tuesday and Wednesday afternoon from 1:00 pm until 4:00 pm. You do not need an appointment for the interview!  [facility] [address], [place], [place] [phone]   [facility] Health Department  [phone]   [facility] Health Department  [phone]   [facility]  [phone]    Behavioral Health Resources in the Community: Intensive Outpatient Programs Organization         Address  Phone  Notes  [facility] [address] [place], [place] [phone]   [facility] Outpatient [address], [place], [place] [phone]   [facility] [address] [place], [place]  [phone]   [facility] Mental Health [address],  [place], [place] [phone] or [phone]   Substance Abuse Resources Organization         Address  Phone  Notes  [facility]  [phone]   [facility]  [phone]   [facility]  [phone]   Floydene Flock  [phone]   Residential & Outpatient Substance Abuse Program  [phone]   Psychological Services Organization         Address  Phone  Notes  [facility] Behavioral Health  [phone]   [facility]  [phone]   [facility] Mental Health [address] [place] [phone] or [phone]    Mobile Crisis Teams Organization         Address  Phone  Notes  [facility], Mobile  Crisis Care Unit  [phone]   [facility]  [address] [place], [place] [phone]   [facility] [address] [place] [phone]    Self-Help/Support  Groups Organization         Address  Phone             Notes  [facility] [place] - variety of support groups  [phone]- I7437963 Call for more information  Narcotics Anonymous (NA), [facility] [address], [employer] [place]  2 meetings at this location   Statistician         Address  Phone  Notes  [facility] [address],    [place]  [phone]   [facility]  [address], [place] [address], [place], [place] [phone]   [facility] [address] [place], [place] [phone] Admissions: 8am-3pm M-F  [facility] [address],    [place], [place] [phone]   [facility] [address] [place], [place], [place] [phone]   [facility] [address],  [place], [place] [phone]   [facility] - Intensive Outpatient [address] Laurell Joseph[address], [place], [place] [phone]   [facility] ([facility]iction [facility] [address] [place]  [place], [place] [phone] or [phone]   [facility] ([facility]) [address], [place], [place] [phone] Accepts Medicaid  [facility] [address],  [place] [phone] Substance Abuse/Addiction Treatment   [facility] Organization         Address  Phone  Notes  [facility]  [phone]   Angie Fava, PhD [address] Ervin Knack [place], [place]   [phone] or [phone]   [facility] Behavioral   [address] [place], [place] [phone]   [facility] [address], [place], [place] [phone] Insurance/Medicaid/sponsorship through [facility] [address]                                    [place], [place] [phone] Therapy/tele-psych/case  [facility] [address], [place] [phone]    Dr. Lolly Mustache  [phone]   [facility]  [employer] [facility] Dept. 1) [address] [place] 2) [address] [place] 3)  [address], [place] [phone]  [phone]   [facility] Child Abuse Hotline 939-426-6165 or [phone] (After Hours)

## 2014-12-28 LAB — URINE CULTURE

## 2016-09-21 ENCOUNTER — Encounter (HOSPITAL_COMMUNITY): Payer: Self-pay

## 2016-09-21 ENCOUNTER — Emergency Department (HOSPITAL_COMMUNITY)
Admission: EM | Admit: 2016-09-21 | Discharge: 2016-09-21 | Disposition: A | Discharge status: Home / Self Care | Payer: BLUE CROSS/BLUE SHIELD | Attending: Emergency Medicine | Admitting: Emergency Medicine

## 2016-09-21 DIAGNOSIS — Z79899 Other long term (current) drug therapy: Secondary | ICD-10-CM | POA: Insufficient documentation

## 2016-09-21 DIAGNOSIS — M79604 Pain in right leg: Secondary | ICD-10-CM | POA: Diagnosis present

## 2016-09-21 LAB — CBC WITH DIFFERENTIAL/PLATELET
BASOS ABS: 0 10*3/uL (ref 0.0–0.1)
BASOS PCT: 0 %
Eosinophils Absolute: 0.1 10*3/uL (ref 0.0–0.7)
Eosinophils Relative: 2 %
HEMATOCRIT: 39.9 % (ref 36.0–46.0)
HEMOGLOBIN: 13.2 g/dL (ref 12.0–15.0)
Lymphocytes Relative: 41 %
Lymphs Abs: 3.7 10*3/uL (ref 0.7–4.0)
MCH: 28.8 pg (ref 26.0–34.0)
MCHC: 33.1 g/dL (ref 30.0–36.0)
MCV: 86.9 fL (ref 78.0–100.0)
Monocytes Absolute: 0.6 10*3/uL (ref 0.1–1.0)
Monocytes Relative: 6 %
NEUTROS ABS: 4.6 10*3/uL (ref 1.7–7.7)
Neutrophils Relative %: 51 %
Platelets: 240 10*3/uL (ref 150–400)
RBC: 4.59 MIL/uL (ref 3.87–5.11)
RDW: 13.9 % (ref 11.5–15.5)
WBC: 8.9 10*3/uL (ref 4.0–10.5)

## 2016-09-21 LAB — BASIC METABOLIC PANEL
ANION GAP: 7 (ref 5–15)
BUN: <5 mg/dL — ABNORMAL LOW (ref 6–20)
CHLORIDE: 106 mmol/L (ref 101–111)
CO2: 24 mmol/L (ref 22–32)
Calcium: 9.1 mg/dL (ref 8.9–10.3)
Creatinine, Ser: 1.03 mg/dL — ABNORMAL HIGH (ref 0.44–1.00)
GFR calc non Af Amer: >60 mL/min (ref >60)
Glucose, Bld: 95 mg/dL (ref 65–99)
Potassium: 3.9 mmol/L (ref 3.5–5.1)
Sodium: 137 mmol/L (ref 135–145)

## 2016-09-21 LAB — I-STAT BETA HCG BLOOD, ED (MC, WL, AP ONLY): I-stat hCG, quantitative: <5 m[IU]/mL (ref <5)

## 2016-09-21 LAB — D-DIMER, QUANTITATIVE (NOT AT ARMC): D DIMER QUANT: 3.62 ug{FEU}/mL — AB (ref 0.00–0.50)

## 2016-09-21 MED ORDER — ENOXAPARIN SODIUM 80 MG/0.8ML ~~LOC~~ SOLN
65.0000 mg | Freq: Once | SUBCUTANEOUS | Status: AC
  Administered 2016-09-21: 65 mg via SUBCUTANEOUS
  Filled 2016-09-21: qty 0.8

## 2016-09-21 NOTE — ED Triage Notes (Signed)
Pt played softball 2 nights ago.  Onset yesterday evening left calf started to hurt. Onset this morning pain worsened,  Leg feels tight.  Pt started new BC pill 1 1/2 months ago.  Pt does not smoke.

## 2016-09-21 NOTE — ED Provider Notes (Signed)
[facility] DEPT Provider Note   CSN: [id number] Arrival date & time: 09/21/16  1625     History   Chief Complaint Chief Complaint  Patient presents with  . Leg Pain    HPI Vicki Contreras is a 36 y.o. female.chief complaint is leg pain  HPI this is a 36 year old female. Started noticing some left leg pain tonight after playing softball. Do not have an injury. Did not feel sudden pop or traumatic episode. Yesterday during the day was sore. Worse today. She was recently started on oral contraceptives. She was cautioned by her physician regarding DVT and asked to recheck if she developed such symptoms.  Denies pain C. Denies recent prolonged immobilization cast splints fractures surgeries malignancies or other DVT or PE risks. No family history of DVT or PE. She is a nonsmoker.  History reviewed. No pertinent past medical history.  There are no active problems to display for this patient.   History reviewed. No pertinent surgical history.  OB History    No data available       Home Medications    Prior to Admission medications   Medication Sig Start Date End Date Taking? Authorizing Provider  Aspirin-Acetaminophen-Caffeine (GOODY HEADACHE PO) Take 1 packet by mouth 2 (two) times daily as needed (pain/headache/cramps). For headache    Yes [provider]  COLLAGEN PO Take 3 tablets by mouth daily with breakfast.   Yes [provider]  Norethin Ace-Eth Estrad-FE (TAYTULLA) 1-20 MG-MCG(24) CAPS Take 1 tablet by mouth See admin instructions. Take 1 tablet by mouth daily at 1pm   Yes [provider]  ibuprofen (ADVIL,MOTRIN) 600 MG tablet Take 1 tablet (600 mg total) by mouth every 6 (six) hours as needed. Patient not taking: Reported on 09/21/2016 12/27/14   Doran Stabler    Family History History reviewed. No pertinent family history.  Social History Social History  Substance Use Topics  . Smoking status: Never Smoker  .  Smokeless tobacco: Never Used  . Alcohol use No     Allergies   Patient has no known allergies.   Review of Systems Review of Systems  Constitutional: Negative for appetite change, chills, diaphoresis, fatigue and fever.  HENT: Negative for mouth sores, sore throat and trouble swallowing.   Eyes: Negative for visual disturbance.  Respiratory: Negative for cough, chest tightness, shortness of breath and wheezing.   Cardiovascular: Negative for chest pain.  Gastrointestinal: Negative for abdominal distention, abdominal pain, diarrhea, nausea and vomiting.  Endocrine: Negative for polydipsia, polyphagia and polyuria.  Genitourinary: Negative for dysuria, frequency and hematuria.  Musculoskeletal: Negative for gait problem.       Leg pain  Skin: Negative for color change, pallor and rash.  Neurological: Negative for dizziness, syncope, light-headedness and headaches.  Hematological: Does not bruise/bleed easily.  Psychiatric/Behavioral: Negative for behavioral problems and confusion.     Physical Exam Updated Vital Signs BP 123/82   Pulse 61   Temp 98.4 F (36.9 C) (Oral)   Resp 17   LMP 09/11/2016   SpO2 100%   Physical Exam  Constitutional: She is oriented to person, place, and time. She appears well-developed and well-nourished. No distress.  HENT:  Head: Normocephalic.  Eyes: Pupils are equal, round, and reactive to light. Conjunctivae are normal. No scleral icterus.  Neck: Normal range of motion. Neck supple. No thyromegaly present.  Cardiovascular: Normal rate and regular rhythm.  Exam reveals no gallop and no friction rub.   No murmur heard. Pulmonary/Chest: Effort  normal and breath sounds normal. No respiratory distress. She has no wheezes. She has no rales.  Abdominal: Soft. Bowel sounds are normal. She exhibits no distension. There is no tenderness. There is no rebound.  Musculoskeletal: Normal range of motion.  R shoulder shows tenderness diffusely in the lower  leg primarily medially. No change in circumference left versus right. Painful to passively dorsiflex the foot. But no defect noted.  Neurological: She is alert and oriented to person, place, and time.  Skin: Skin is warm and dry. No rash noted.  Psychiatric: She has a normal mood and affect. Her behavior is normal.     ED Treatments / Results  Labs (all labs ordered are listed, but only abnormal results are displayed) Labs Reviewed  D-DIMER, QUANTITATIVE (NOT AT [facility]) - Abnormal; Notable for the following:       Result Value   D-Dimer, Quant 3.62 (*)    All other components within normal limits  CBC WITH DIFFERENTIAL/PLATELET  BASIC METABOLIC PANEL  I-STAT BETA HCG BLOOD, ED ([facility], [facility], [facility] ONLY)    EKG  EKG Interpretation None       Radiology No results found.  Procedures Procedures (including critical care time)  Medications Ordered in ED Medications  enoxaparin (LOVENOX) injection 65 mg (65 mg Subcutaneous Given 09/21/16 2257)     Initial Impression / Assessment and Plan / ED Course  I have reviewed the triage vital signs and the nursing notes.  Pertinent labs & imaging results that were available during my care of the patient were reviewed by me and considered in my medical decision making (see chart for details).   fragile diagnosis does include DVT with recent institution of OCPs. Doppler ultrasound unavailable at the time of patient's presentation. D-dimer is markedly elevated at greater than 3. No contraindications. Given subcutaneous Lovenox. Arranged made for recheck tomorrow morning for outpatient Doppler ultrasound left lower extremity to rule out DVT.  Final Clinical Impressions(s) / ED Diagnoses   Final diagnoses:  Right leg pain    New Prescriptions Discharge Medication List as of 09/21/2016 10:26 PM       Rolland Porter, MD 09/21/16 2316

## 2016-09-21 NOTE — Discharge Instructions (Signed)
Return to emergency room check in tomorrow at instructed time. Do NOT check into the emergency room, tell them you were here to have a Doppler ultrasound of your leg.

## 2016-09-21 NOTE — ED Notes (Signed)
Pt stable, ambulatory, states understanding of discharge instructions and vascular study directions

## 2016-09-22 ENCOUNTER — Emergency Department (HOSPITAL_COMMUNITY)
Admission: EM | Admit: 2016-09-22 | Discharge: 2016-09-22 | Disposition: A | Discharge status: Home / Self Care | Payer: BLUE CROSS/BLUE SHIELD | Attending: Emergency Medicine | Admitting: Emergency Medicine

## 2016-09-22 ENCOUNTER — Encounter (HOSPITAL_COMMUNITY): Payer: Self-pay | Admitting: Emergency Medicine

## 2016-09-22 ENCOUNTER — Ambulatory Visit (HOSPITAL_COMMUNITY)
Admission: RE | Admit: 2016-09-22 | Discharge: 2016-09-22 | Disposition: A | Discharge status: Home / Self Care | Payer: BLUE CROSS/BLUE SHIELD | Source: Ambulatory Visit | Attending: Emergency Medicine | Admitting: Emergency Medicine

## 2016-09-22 DIAGNOSIS — Y929 Unspecified place or not applicable: Secondary | ICD-10-CM | POA: Diagnosis not present

## 2016-09-22 DIAGNOSIS — M79609 Pain in unspecified limb: Secondary | ICD-10-CM | POA: Diagnosis not present

## 2016-09-22 DIAGNOSIS — M79605 Pain in left leg: Secondary | ICD-10-CM | POA: Diagnosis present

## 2016-09-22 DIAGNOSIS — Y939 Activity, unspecified: Secondary | ICD-10-CM | POA: Insufficient documentation

## 2016-09-22 DIAGNOSIS — Y33XXXA Other specified events, undetermined intent, initial encounter: Secondary | ICD-10-CM | POA: Diagnosis not present

## 2016-09-22 DIAGNOSIS — M79662 Pain in left lower leg: Secondary | ICD-10-CM

## 2016-09-22 DIAGNOSIS — S86812D Strain of other muscle(s) and tendon(s) at lower leg level, left leg, subsequent encounter: Secondary | ICD-10-CM

## 2016-09-22 DIAGNOSIS — Y998 Other external cause status: Secondary | ICD-10-CM | POA: Diagnosis not present

## 2016-09-22 DIAGNOSIS — S86112A Strain of other muscle(s) and tendon(s) of posterior muscle group at lower leg level, left leg, initial encounter: Secondary | ICD-10-CM | POA: Insufficient documentation

## 2016-09-22 MED ORDER — CYCLOBENZAPRINE HCL 10 MG PO TABS: 10.0000 mg | ORAL_TABLET | Freq: Two times a day (BID) | ORAL | 0 refills | Status: DC | PRN

## 2016-09-22 MED ORDER — METHYLPREDNISOLONE 4 MG PO TBPK: ORAL_TABLET | ORAL | 0 refills | Status: DC

## 2016-09-22 MED ORDER — METHOCARBAMOL 500 MG PO TABS: 500.0000 mg | ORAL_TABLET | Freq: Two times a day (BID) | ORAL | 0 refills | Status: DC

## 2016-09-22 NOTE — Discharge Instructions (Signed)
Please read attached information regarding your condition and heat therapy. Take steroid taper over 6 days. Take Robaxin twice daily as needed. Follow-up with orthopedist list below for further evaluation. Return to ED for worsening pain, injury, trouble walking, numbness, increased swelling, signs of infected joint.

## 2016-09-22 NOTE — Progress Notes (Signed)
VASCULAR LAB PRELIMINARY  PRELIMINARY  PRELIMINARY  PRELIMINARY  Left lower extremity venous duplex completed.    Preliminary report:  There is no obvious evidence of DVT or SVT noted in the left lower extremity.   Gwen Edler, RVT 09/22/2016, 9:20 AM

## 2016-09-22 NOTE — ED Provider Notes (Signed)
[facility]-EMERGENCY DEPT Provider Note   CSN: [id number] Arrival date & time: 09/22/16  7262     History   Chief Complaint Chief Complaint  Patient presents with  . calf pain/neg DVT    HPI Vicki Contreras is a 36 y.o. female.  HPI  Patient presents to ED for evaluation of left calf pain for the past 2 days. She states that she was seen here yesterday, given Lovenox and told to return this morning for ultrasound to rule out DVT. She states that the ultrasound was negative. However she still continues to have pain. She is unsure if this is a muscle strain because she denies any known injury or falls. She does play softball. She has not tried anything for pain. She states she is ambulatory but pain is worse with weightbearing. She was recently started on oral contraceptive pills so she was concerned about a blood clot which caused her to come in yesterday. She denies any chest pain, hemoptysis, trouble breathing, numbness, vomiting.  History reviewed. No pertinent past medical history.  There are no active problems to display for this patient.   No past surgical history on file.  OB History    No data available       Home Medications    Prior to Admission medications   Medication Sig Start Date End Date Taking? Authorizing Provider  Aspirin-Acetaminophen-Caffeine (GOODY HEADACHE PO) Take 1 packet by mouth 2 (two) times daily as needed (pain/headache/cramps). For headache     [provider]  COLLAGEN PO Take 3 tablets by mouth daily with breakfast.    [provider]  ibuprofen (ADVIL,MOTRIN) 600 MG tablet Take 1 tablet (600 mg total) by mouth every 6 (six) hours as needed. Patient not taking: Reported on 09/21/2016 12/27/14   Mady Gemma, PA-C  methocarbamol (ROBAXIN) 500 MG tablet Take 1 tablet (500 mg total) by mouth 2 (two) times daily. 09/22/16   Caelen Reierson, PA-C  methylPREDNISolone (MEDROL DOSEPAK) 4 MG TBPK tablet Taper over 6 days. 09/22/16    Iden Stripling, PA-C  Norethin Ace-Eth Estrad-FE (TAYTULLA) 1-20 MG-MCG(24) CAPS Take 1 tablet by mouth See admin instructions. Take 1 tablet by mouth daily at 1pm    [provider]    Family History No family history on file.  Social History Social History  Substance Use Topics  . Smoking status: Never Smoker  . Smokeless tobacco: Never Used  . Alcohol use No     Allergies   Patient has no known allergies.   Review of Systems Review of Systems  Constitutional: Negative for chills and fever.  Respiratory: Negative for cough and shortness of breath.   Cardiovascular: Negative for chest pain.  Gastrointestinal: Negative for nausea and vomiting.  Musculoskeletal: Positive for gait problem and myalgias. Negative for arthralgias, back pain and joint swelling.  Neurological: Negative for weakness, light-headedness and numbness.     Physical Exam Updated Vital Signs BP 110/78   Pulse 71   Temp 98 F (36.7 C)   Resp 18   LMP 09/11/2016   SpO2 99%   Physical Exam  Constitutional: She appears well-developed and well-nourished. No distress.  HENT:  Head: Normocephalic and atraumatic.  Eyes: Conjunctivae and EOM are normal. No scleral icterus.  Neck: Normal range of motion.  Pulmonary/Chest: Effort normal. No respiratory distress.  Musculoskeletal: Normal range of motion. She exhibits tenderness. She exhibits no edema or deformity.  Tenderness to palpation of the left calf. No color or temperature change noted of  skin or joint. Full active and passive range of motion of knee and ankle. 2+ DP pulses bilaterally. Sensation intact to light touch. Strength 5/5 in bilateral lower extremities.  Neurological: She is alert.  Skin: No rash noted. She is not diaphoretic.  Psychiatric: She has a normal mood and affect.  Nursing note and vitals reviewed.    ED Treatments / Results  Labs (all labs ordered are listed, but only abnormal results are displayed) Labs Reviewed -  No data to display  EKG  EKG Interpretation None       Radiology No results found.  Procedures Procedures (including critical care time)  Medications Ordered in ED Medications - No data to display   Initial Impression / Assessment and Plan / ED Course  I have reviewed the triage vital signs and the nursing notes.  Pertinent labs & imaging results that were available during my care of the patient were reviewed by me and considered in my medical decision making (see chart for details).     Patient presents to ED for left calf pain. Her ultrasound this morning to rule out DVT was negative. She is ambulatory but states pain worse with weightbearing. She was recently started on OCPs. D-dimer yesterday was 3.6. On physical exam there is tenderness to palpation of the calf but neurovascularly intact. She denies any injuries. There is no significant swelling appreciated. She is afebrile with no history of fever. She has full active and passive range of motion of the joints which has low suspicion for septic joint. No temperature change concerning for compartment syndrome. We'll give the patient steroids and muscle relaxer for possible muscle strain and stiffness. Encouraged heat therapy and stretching. Referred to orthopedics for further evaluation. Patient appears stable for discharge at this time. Strict return precautions given.  Final Clinical Impressions(s) / ED Diagnoses   Final diagnoses:  Pain of left calf  Strain of calf muscle, left, subsequent encounter    New Prescriptions New Prescriptions   METHOCARBAMOL (ROBAXIN) 500 MG TABLET    Take 1 tablet (500 mg total) by mouth 2 (two) times daily.   METHYLPREDNISOLONE (MEDROL DOSEPAK) 4 MG TBPK TABLET    Taper over 6 days.     Dietrich Pates, PA-C 09/22/16 1036    Shaune Pollack, MD 09/23/16 [phone]

## 2016-09-22 NOTE — ED Notes (Signed)
Pt here following outpatient ultrasound which was negative.

## 2016-09-22 NOTE — ED Triage Notes (Addendum)
Pt reports calf pain after playing softball 3 nights ago, to left calf. Positive D-dimer yesterday. Came to hospital this morning for DVT study, it was negative. PT did not want to leave hospital still in pain and without a referral to an orthopedic. Pt also states she might need an assistive device for walking due to calf pain.  Pt states she has been trying to stretch out the muscle. Pt states she has no prescriptions for muscle/pain.

## 2016-10-14 ENCOUNTER — Emergency Department (HOSPITAL_COMMUNITY)
Admission: EM | Admit: 2016-10-14 | Discharge: 2016-10-15 | Disposition: A | Discharge status: Home / Self Care | Payer: BLUE CROSS/BLUE SHIELD | Attending: Emergency Medicine | Admitting: Emergency Medicine

## 2016-10-14 ENCOUNTER — Encounter (HOSPITAL_COMMUNITY): Payer: Self-pay | Admitting: *Deleted

## 2016-10-14 DIAGNOSIS — Z79899 Other long term (current) drug therapy: Secondary | ICD-10-CM | POA: Diagnosis not present

## 2016-10-14 DIAGNOSIS — H109 Unspecified conjunctivitis: Secondary | ICD-10-CM

## 2016-10-14 DIAGNOSIS — H5711 Ocular pain, right eye: Secondary | ICD-10-CM | POA: Diagnosis present

## 2016-10-14 MED ORDER — TETRACAINE HCL 0.5 % OP SOLN
2.0000 [drp] | Freq: Once | OPHTHALMIC | Status: AC
  Administered 2016-10-14: 2 [drp] via OPHTHALMIC
  Filled 2016-10-14: qty 4

## 2016-10-14 MED ORDER — FLUORESCEIN SODIUM 0.6 MG OP STRP
1.0000 | ORAL_STRIP | Freq: Once | OPHTHALMIC | Status: AC
  Administered 2016-10-14: 1 via OPHTHALMIC
  Filled 2016-10-14: qty 1

## 2016-10-14 NOTE — ED Notes (Signed)
Pt removed contact from right eye. Pt wearing contact in left eye. Pt able to read 20/20 with both eyes. Pt able to read 20/125 with right eye and 20/20 with left eye.

## 2016-10-14 NOTE — ED Triage Notes (Signed)
Pt here for redness in right eye that has been increasing since Friday.  No drainage.  No change in vision

## 2016-10-14 NOTE — ED Provider Notes (Signed)
[facility] Provider Note   CSN: [id number] Arrival date & time: 10/14/16  2047     History   Chief Complaint Chief Complaint  Patient presents with  . Eye Problem    HPI Vicki Contreras is a 36 y.o. female who presents today for evaluation of pain in her right eye. She reports that on Friday she noticed that her right eye was red. She denies any fevers or chills, she has not had any symptoms consistent with a cold in the past few weeks. She says that no one else has this problem. She is not waking up with her eyes "matted" shut.  She denies any drainage from her eye. She wears contacts every day, states that she has not had any difficulties removing her contacts. She uses contacts that last for 30 days and she is in her second week. She has not had any changes to contact solution  HPI  History reviewed. No pertinent past medical history.  There are no active problems to display for this patient.   History reviewed. No pertinent surgical history.  OB History    No data available       Home Medications    Prior to Admission medications   Medication Sig Start Date End Date Taking? Authorizing Provider  Aspirin-Acetaminophen-Caffeine (GOODY HEADACHE PO) Take 1 packet by mouth 2 (two) times daily as needed (pain/headache/cramps). For headache     [provider]  COLLAGEN PO Take 3 tablets by mouth daily with breakfast.    [provider]  cyclobenzaprine (FLEXERIL) 10 MG tablet Take 1 tablet (10 mg total) by mouth 2 (two) times daily as needed for muscle spasms. 09/22/16   Khatri, Hina, PA-C  ibuprofen (ADVIL,MOTRIN) 600 MG tablet Take 1 tablet (600 mg total) by mouth every 6 (six) hours as needed. Patient not taking: Reported on 09/21/2016 12/27/14   Mady Gemma, PA-C  methocarbamol (ROBAXIN) 500 MG tablet Take 1 tablet (500 mg total) by mouth 2 (two) times daily. 09/22/16   Khatri, Hina, PA-C  methylPREDNISolone (MEDROL DOSEPAK) 4 MG TBPK  tablet Taper over 6 days. 09/22/16   Khatri, Hina, PA-C  Norethin Ace-Eth Estrad-FE (TAYTULLA) 1-20 MG-MCG(24) CAPS Take 1 tablet by mouth See admin instructions. Take 1 tablet by mouth daily at 1pm    [provider]    Family History No family history on file.  Social History Social History  Substance Use Topics  . Smoking status: Never Smoker  . Smokeless tobacco: Never Used  . Alcohol use No     Allergies   Patient has no known allergies.   Review of Systems Review of Systems  Constitutional: Negative for chills and fever.  HENT: Negative for congestion, drooling, sinus pain, sinus pressure and sore throat.   Eyes: Positive for pain and redness. Negative for photophobia, discharge and itching.  Respiratory: Negative for shortness of breath.      Physical Exam Updated Vital Signs BP 97/75 (BP Location: Right Arm)   Pulse 81   Temp 98.4 F (36.9 C) (Oral)   Resp 16   Wt 65.8 kg (145 lb)   SpO2 100%   Physical Exam  Constitutional: She appears well-developed and well-nourished.  HENT:  Head: Normocephalic and atraumatic.  Eyes: Pupils are equal, round, and reactive to light. EOM and lids are normal. Right eye exhibits no discharge. Left eye exhibits no discharge. Right conjunctiva is injected. Right conjunctiva has no hemorrhage. Left conjunctiva is not injected. Left conjunctiva has no  hemorrhage. No scleral icterus. Right eye exhibits normal extraocular motion and no nystagmus. Left eye exhibits normal extraocular motion and no nystagmus.  Slit lamp exam:      The right eye shows no corneal abrasion, no corneal flare, no corneal ulcer, no foreign body, no fluorescein uptake and no anterior chamber bulge.  Neck: Normal range of motion. Neck supple.  Pulmonary/Chest: Effort normal.  Lymphadenopathy:    She has no cervical adenopathy.  Neurological: She is alert.  Skin: She is not diaphoretic.  Nursing note and vitals reviewed.    ED Treatments /  Results  Labs (all labs ordered are listed, but only abnormal results are displayed) Labs Reviewed - No data to display  EKG  EKG Interpretation None       Radiology No results found.  Procedures Procedures (including critical care time)  Medications Ordered in ED Medications  fluorescein ophthalmic strip 1 strip (1 strip Right Eye Given 10/14/16 2342)  tetracaine (PONTOCAINE) 0.5 % ophthalmic solution 2 drop (2 drops Right Eye Given 10/14/16 2342)     Initial Impression / Assessment and Plan / ED Course  I have reviewed the triage vital signs and the nursing notes.  Pertinent labs & imaging results that were available during my care of the patient were reviewed by me and considered in my medical decision making (see chart for details).    Viral conjunctivitis  Patient presentation consistent with viral conjunctivitis.  No purulent discharge, corneal abrasions, entrapment, consensual photophobia, or dendritic staining with fluorescein study.  Presentation non-concerning for iritis, bacterial conjunctivitis, corneal abrasions, or HSV.  No antibiotics are indicated.  Personal hygiene and frequent handwashing discussed.  Patient advised to followup with ophthalmologist if symptoms persist or worsen in any way including vision change or purulent discharge.  Patient verbalizes understanding and is agreeable with discharge.  She was advised not to wear her contacts and to throw away the ones she has been wearing recently.   Final Clinical Impressions(s) / ED Diagnoses   Final diagnoses:  Conjunctivitis of right eye, unspecified conjunctivitis type    New Prescriptions Discharge Medication List as of 10/15/2016 12:13 AM       Cristina Gong, PA-C 10/15/16 Rocky Morel    Shaune Pollack, MD 10/16/16 [phone]

## 2016-10-15 NOTE — Discharge Instructions (Signed)
If your eye worsens, or you have any concerns please follow-up with your eye doctor.

## 2016-11-20 ENCOUNTER — Encounter: Payer: PRIVATE HEALTH INSURANCE | Admitting: Vascular Surgery

## 2017-08-26 ENCOUNTER — Encounter (HOSPITAL_COMMUNITY): Payer: Self-pay | Admitting: Emergency Medicine

## 2017-08-26 ENCOUNTER — Other Ambulatory Visit: Payer: Self-pay

## 2017-08-26 ENCOUNTER — Emergency Department (HOSPITAL_COMMUNITY)
Admission: EM | Admit: 2017-08-26 | Discharge: 2017-08-26 | Disposition: A | Discharge status: Home / Self Care | Payer: BLUE CROSS/BLUE SHIELD | Attending: Emergency Medicine | Admitting: Emergency Medicine

## 2017-08-26 DIAGNOSIS — Z79899 Other long term (current) drug therapy: Secondary | ICD-10-CM | POA: Insufficient documentation

## 2017-08-26 DIAGNOSIS — Y998 Other external cause status: Secondary | ICD-10-CM | POA: Diagnosis not present

## 2017-08-26 DIAGNOSIS — Y9364 Activity, baseball: Secondary | ICD-10-CM | POA: Insufficient documentation

## 2017-08-26 DIAGNOSIS — Y92320 Baseball field as the place of occurrence of the external cause: Secondary | ICD-10-CM | POA: Diagnosis not present

## 2017-08-26 DIAGNOSIS — S0993XA Unspecified injury of face, initial encounter: Secondary | ICD-10-CM | POA: Diagnosis present

## 2017-08-26 DIAGNOSIS — W2107XA Struck by softball, initial encounter: Secondary | ICD-10-CM | POA: Insufficient documentation

## 2017-08-26 DIAGNOSIS — S01512A Laceration without foreign body of oral cavity, initial encounter: Secondary | ICD-10-CM | POA: Insufficient documentation

## 2017-08-26 MED ORDER — LIDOCAINE HCL 2 % IJ SOLN
20.0000 mL | Freq: Once | INTRAMUSCULAR | Status: AC
  Administered 2017-08-26: 400 mg
  Filled 2017-08-26: qty 20

## 2017-08-26 MED ORDER — CEPHALEXIN 500 MG PO CAPS: 500.0000 mg | ORAL_CAPSULE | Freq: Two times a day (BID) | ORAL | 0 refills | Status: DC

## 2017-08-26 MED ORDER — CEPHALEXIN 250 MG PO CAPS
500.0000 mg | ORAL_CAPSULE | Freq: Once | ORAL | Status: AC
  Administered 2017-08-26: 500 mg via ORAL
  Filled 2017-08-26: qty 2

## 2017-08-26 NOTE — ED Triage Notes (Signed)
Pt reports being struck on the L cheek/mouth by a softball thrown at a moderate speed. Denies LOC. Reports blood, injury to inside of cheek.

## 2017-08-26 NOTE — ED Provider Notes (Signed)
[facility] EMERGENCY DEPARTMENT Provider Note   CSN: [id number] Arrival date & time: 08/26/17  2053   History   Chief Complaint Chief Complaint  Patient presents with  . Facial Injury    HPI Vicki Contreras is a 37 y.o. female.  HPI   32 YOF presents today with complaints of facial injury.  She notes she was hit in the face with a softball causing a laceration to the left intraoral region.  She denies any dental damage, denies any facial pain, decreased range of motion of the jaw.  No neck pain loss of consciousness or any neurological deficits.  Tetanus is up-to-date.   History reviewed. No pertinent past medical history.  There are no active problems to display for this patient.   History reviewed. No pertinent surgical history.   OB History   None      Home Medications    Prior to Admission medications   Medication Sig Start Date End Date Taking? Authorizing Provider  Aspirin-Acetaminophen-Caffeine (GOODY HEADACHE PO) Take 1 packet by mouth 2 (two) times daily as needed (pain/headache/cramps). For headache     [provider]  cephALEXin (KEFLEX) 500 MG capsule Take 1 capsule (500 mg total) by mouth 2 (two) times daily. 08/26/17   Jemuel Laursen, Tinnie Gens, PA-C  COLLAGEN PO Take 3 tablets by mouth daily with breakfast.    [provider]  cyclobenzaprine (FLEXERIL) 10 MG tablet Take 1 tablet (10 mg total) by mouth 2 (two) times daily as needed for muscle spasms. 09/22/16   Khatri, Hina, PA-C  ibuprofen (ADVIL,MOTRIN) 600 MG tablet Take 1 tablet (600 mg total) by mouth every 6 (six) hours as needed. Patient not taking: Reported on 09/21/2016 12/27/14   Mady Gemma, PA-C  methocarbamol (ROBAXIN) 500 MG tablet Take 1 tablet (500 mg total) by mouth 2 (two) times daily. 09/22/16   Khatri, Hina, PA-C  methylPREDNISolone (MEDROL DOSEPAK) 4 MG TBPK tablet Taper over 6 days. 09/22/16   Khatri, Hina, PA-C  Norethin Ace-Eth Estrad-FE (TAYTULLA)  1-20 MG-MCG(24) CAPS Take 1 tablet by mouth See admin instructions. Take 1 tablet by mouth daily at 1pm    [provider]    Family History History reviewed. No pertinent family history.  Social History Social History   Tobacco Use  . Smoking status: Never Smoker  . Smokeless tobacco: Never Used  Substance Use Topics  . Alcohol use: No  . Drug use: No     Allergies   Patient has no known allergies.   Review of Systems Review of Systems  All other systems reviewed and are negative.    Physical Exam Updated Vital Signs BP 118/79 (BP Location: Right Arm)   Pulse 90   Temp 99.3 F (37.4 C) (Oral)   Resp 16   Ht 5\' 3"  (1.6 m)   Wt 66.7 kg (147 lb)   LMP 08/24/2017   SpO2 100%   BMI 26.04 kg/m   Physical Exam  Constitutional: She is oriented to person, place, and time. She appears well-developed and well-nourished.  HENT:  Head: Normocephalic and atraumatic.  Minor swelling to the left cheek and soft tissue no lacerations on the outside of the skin-1.5 cm laceration to the left buccal mucosa  Jaw full active range of motion nontender  Eyes: Pupils are equal, round, and reactive to light. Conjunctivae are normal. Right eye exhibits no discharge. Left eye exhibits no discharge. No scleral icterus.  Neck: Normal range of motion. No JVD present. No  tracheal deviation present.  Pulmonary/Chest: Effort normal. No stridor.  Musculoskeletal:  No C-spine tenderness neck full active range of motion  Neurological: She is alert and oriented to person, place, and time. Coordination normal.  Psychiatric: She has a normal mood and affect. Her behavior is normal. Judgment and thought content normal.  Nursing note and vitals reviewed.    ED Treatments / Results  Labs (all labs ordered are listed, but only abnormal results are displayed) Labs Reviewed - No data to display  EKG None  Radiology No results found.  Procedures .Marland Kitchen.Laceration Repair Date/Time:  08/27/2017 5:00 PM Performed by: Eyvonne MechanicHedges, Carrol Bondar, PA-C Authorized by: Eyvonne MechanicHedges, Joanette Silveria, PA-C   Anesthesia (see MAR for exact dosages):    Anesthesia method:  Local infiltration   Local anesthetic:  Lidocaine 2% w/o epi Laceration details:    Location:  Mouth   Length (cm):  1.5 Exploration:    Wound exploration: wound explored through full range of motion and entire depth of wound probed and visualized     Wound extent: no fascia violation noted, no foreign bodies/material noted, no muscle damage noted, no underlying fracture noted and no vascular damage noted   Skin repair:    Repair method:  Sutures   Suture size:  4-0   Suture material:  Fast-absorbing gut   Suture technique:  Simple interrupted   Number of sutures:  4 Approximation:    Approximation:  Close Post-procedure details:    Patient tolerance of procedure:  Tolerated well, no immediate complications   (including critical care time)  Medications Ordered in ED Medications  lidocaine (XYLOCAINE) 2 % (with pres) injection 400 mg (400 mg Infiltration Given 08/26/17 2124)  cephALEXin (KEFLEX) capsule 500 mg (500 mg Oral Given 08/26/17 2238)     Initial Impression / Assessment and Plan / ED Course  I have reviewed the triage vital signs and the nursing notes.  Pertinent labs & imaging results that were available during my care of the patient were reviewed by me and considered in my medical decision making (see chart for details).     37 year old female presents today with facial injury.  She has a laceration inside her mouth this was repaired without complication.  Tetanus is up-to-date prophylactic antibiotics given return precautions given.  Patient verbalized understanding and agreement to today's plan.  Final Clinical Impressions(s) / ED Diagnoses   Final diagnoses:  Laceration of oral cavity, initial encounter    ED Discharge Orders        Ordered    cephALEXin (KEFLEX) 500 MG capsule  2 times daily      08/26/17 2219       Eyvonne MechanicHedges, Dustine Stickler, PA-C 08/27/17 1701    Bethann BerkshireZammit, Joseph, MD 08/27/17 [phone]

## 2017-08-26 NOTE — Discharge Instructions (Addendum)
Please read attached information. If you experience any new or worsening signs or symptoms please return to the emergency room for evaluation. Please follow-up with your primary care provider or specialist as discussed. Please use medication prescribed only as directed and discontinue taking if you have any concerning signs or symptoms.   °

## 2017-08-26 NOTE — ED Notes (Signed)
Patient able to ambulate independently  

## 2018-12-17 ENCOUNTER — Other Ambulatory Visit: Payer: Self-pay

## 2018-12-17 DIAGNOSIS — Z20822 Contact with and (suspected) exposure to covid-19: Secondary | ICD-10-CM

## 2018-12-21 LAB — NOVEL CORONAVIRUS, NAA: SARS-CoV-2, NAA: NOT DETECTED

## 2021-09-14 ENCOUNTER — Encounter: Payer: Self-pay | Admitting: Radiology

## 2021-09-14 ENCOUNTER — Ambulatory Visit: Payer: 59 | Admitting: Radiology

## 2021-09-14 VITALS — BP 110/74 | Ht 63.0 in | Wt 157.0 lb

## 2021-09-14 DIAGNOSIS — Z30433 Encounter for removal and reinsertion of intrauterine contraceptive device: Secondary | ICD-10-CM

## 2021-09-14 DIAGNOSIS — Z30431 Encounter for routine checking of intrauterine contraceptive device: Secondary | ICD-10-CM | POA: Diagnosis not present

## 2021-09-14 NOTE — Progress Notes (Signed)
   Vicki Contreras 09/19/1980 [id number]   History:  41 y.o. G0 presents for visit to discuss changing Kyleena. Due for replacement before 11/19/21.  Gynecologic History No LMP recorded. (Menstrual status: IUD).   Contraception/Family planning: IUD Sexually active: no Last Pap: 11/2020. Results were: normal Last mammogram: 11/2020. Results were: normal  Obstetric History OB History  Gravida Para Term Preterm AB Living  0 0 0 0 0 0  SAB IAB Ectopic Multiple Live Births  0 0 0 0 0     The following portions of the patient's history were reviewed and updated as appropriate: allergies, current medications, past family history, past medical history, past social history, past surgical history, and problem list.  Review of Systems Pertinent items noted in HPI and remainder of comprehensive ROS otherwise negative.   Past medical history, past surgical history, family history and social history were all reviewed and documented in the EPIC chart.   Exam:  Vitals:   09/14/21 1409  BP: 110/74  Weight: 157 lb (71.2 kg)  Height: 5\' 3"  (1.6 m)   Body mass index is 27.81 kg/m.  Physical Exam Constitutional:      Appearance: Normal appearance. She is normal weight.  Pulmonary:     Effort: Pulmonary effort is normal.  Abdominal:     General: Abdomen is flat. Bowel sounds are normal.     Palpations: Abdomen is soft.  Neurological:     Mental Status: She is alert and oriented to person, place, and time. Mental status is at baseline.  Psychiatric:        Mood and Affect: Mood normal.        Thought Content: Thought content normal.        Judgment: Judgment normal.      Assessment/Plan:   1. Encounter for removal and reinsertion of intrauterine contraceptive device (IUD) Due for replacement before 11/19/21 - IUD Insertion; Future     Discussed SBE, colonoscopy and DEXA screening as directed/appropriate. Recommend 11/21/21 of exercise weekly, including weight bearing  exercise. Encouraged the use of seatbelts and sunscreen. Return in 1 year for annual or as needed.   B WHNP-BC 2:41 PM 09/14/2021

## 2021-10-16 ENCOUNTER — Ambulatory Visit: Payer: 59 | Admitting: Radiology

## 2021-10-16 ENCOUNTER — Encounter: Payer: Self-pay | Admitting: Radiology

## 2021-10-16 VITALS — BP 124/72

## 2021-10-16 DIAGNOSIS — Z30433 Encounter for removal and reinsertion of intrauterine contraceptive device: Secondary | ICD-10-CM | POA: Diagnosis not present

## 2021-10-16 DIAGNOSIS — Z113 Encounter for screening for infections with a predominantly sexual mode of transmission: Secondary | ICD-10-CM

## 2021-10-16 DIAGNOSIS — Z01812 Encounter for preprocedural laboratory examination: Secondary | ICD-10-CM | POA: Diagnosis not present

## 2021-10-16 LAB — PREGNANCY, URINE: Preg Test, Ur: NEGATIVE

## 2021-10-16 MED ORDER — LEVONORGESTREL 19.5 MG IU IUD: INTRAUTERINE_SYSTEM | Freq: Once | INTRAUTERINE | Status: DC

## 2021-10-16 NOTE — Progress Notes (Signed)
   Vicki Contreras 23-Mar-1980 [id number]   History:  41 y.o. G0 presents for replacement of Kyleena IUD.  Pt has been counseled about risks and benefits as well as complications.  Consent is obtained today.  No LMP recorded. (Menstrual status: IUD). GC/CT/Trich testing: obtained  Past medical history, past surgical history, family history and social history were all reviewed and documented in the EPIC chart.  ROS:  A ROS was performed and pertinent positives and negatives are included.  Exam: Vitals:   10/16/21 1455  BP: 124/72   There is no height or weight on file to calculate BMI.  Pelvic exam: Vulva:  normal female genitalia Vagina:  normal vagina, no discharge, exudate, lesion, or erythema Cervix:  Non-tender, Negative CMT, no lesions or redness. Uterus:  normal shape, position and consistency    Procedure:  Speculum inserted.   Cervix visualized and cleansed with Betadine x 3.  Tenaculum placed on anterior cervix. IUD strings were not seen at the os and were unable to be tweezed out with a cytobrush. A Mathieu IUD forcep was used to removal the IUD. Then uterus sounded to 8 cm. IUD inserted easily. Strings trimmed to 3 cm.  Minimal bleeding noted.  Pt tolerated the procedure well.  Chaperone present: Leatrice Jewels, CMA   Assessment/Plan: 1. Encounter for removal and reinsertion of intrauterine contraceptive device (IUD) - IUD Insertion - levonorgestrel (KYLEENA) 19.5 MG IUD  2. Encounter for preprocedural laboratory examination - Pregnancy, urine;negative  3. Routine screening for STI (sexually transmitted infection) - SURESWAB CT/NG/T. vaginalis    Return for recheck 4-6 weeks Pt aware to call for any concerns Pt aware removal due no later than 10/17/2026, IUD card given to pt.   Rubbie Battiest B WHNP-BC, 3:14 PM 10/16/2021

## 2021-10-17 LAB — SURESWAB CT/NG/T. VAGINALIS
C. trachomatis RNA, TMA: NOT DETECTED
N. gonorrhoeae RNA, TMA: NOT DETECTED
Trichomonas vaginalis RNA: NOT DETECTED

## 2021-11-08 ENCOUNTER — Other Ambulatory Visit: Payer: Self-pay | Admitting: Radiology

## 2021-11-08 DIAGNOSIS — Z1231 Encounter for screening mammogram for malignant neoplasm of breast: Secondary | ICD-10-CM

## 2021-11-09 ENCOUNTER — Ambulatory Visit: Payer: 59 | Admitting: Radiology

## 2021-12-28 ENCOUNTER — Ambulatory Visit (INDEPENDENT_AMBULATORY_CARE_PROVIDER_SITE_OTHER): Payer: 59 | Admitting: Radiology

## 2021-12-28 ENCOUNTER — Encounter: Payer: Self-pay | Admitting: Radiology

## 2021-12-28 VITALS — BP 112/76 | Ht 63.25 in | Wt 157.0 lb

## 2021-12-28 DIAGNOSIS — Z113 Encounter for screening for infections with a predominantly sexual mode of transmission: Secondary | ICD-10-CM

## 2021-12-28 DIAGNOSIS — Z30431 Encounter for routine checking of intrauterine contraceptive device: Secondary | ICD-10-CM | POA: Diagnosis not present

## 2021-12-28 DIAGNOSIS — Z01419 Encounter for gynecological examination (general) (routine) without abnormal findings: Secondary | ICD-10-CM | POA: Diagnosis not present

## 2021-12-28 NOTE — Progress Notes (Signed)
   Vicki Contreras 1980/10/21 [id number]   History:  41 y.o. G0 presents for annual exam and IUD check after insertion 8 weeks ago. Doing well, no gyn concerns.  Gynecologic History No LMP recorded. (Menstrual status: IUD).   Contraception/Family planning: IUD Sexually active: yes Last Pap: 2022. Results were: normal Last mammogram: 2022. Results were: normal  Obstetric History OB History  Gravida Para Term Preterm AB Living  0 0 0 0 0 0  SAB IAB Ectopic Multiple Live Births  0 0 0 0 0     The following portions of the patient's history were reviewed and updated as appropriate: allergies, current medications, past family history, past medical history, past social history, past surgical history, and problem list.  Review of Systems Pertinent items noted in HPI and remainder of comprehensive ROS otherwise negative.   Past medical history, past surgical history, family history and social history were all reviewed and documented in the EPIC chart.   Exam:  Vitals:   12/28/21 1330  BP: 112/76  Weight: 157 lb (71.2 kg)  Height: 5' 3.25" (1.607 m)   Body mass index is 27.59 kg/m.  General appearance:  Normal Thyroid:  Symmetrical, normal in size, without palpable masses or nodularity. Respiratory  Auscultation:  Clear without wheezing or rhonchi Cardiovascular  Auscultation:  Regular rate, without rubs, murmurs or gallops  Edema/varicosities:  Not grossly evident Abdominal  Soft,nontender, without masses, guarding or rebound.  Liver/spleen:  No organomegaly noted  Hernia:  None appreciated  Skin  Inspection:  Grossly normal Breasts: Examined lying and sitting.   Right: Without masses, retractions, nipple discharge or axillary adenopathy.   Left: Without masses, retractions, nipple discharge or axillary adenopathy. Genitourinary   Inguinal/mons:  Normal without inguinal adenopathy  External genitalia:  Normal appearing vulva with no masses, tenderness, or  lesions  BUS/Urethra/Skene's glands:  Normal without masses or exudate  Vagina:  Normal appearing with normal color and discharge, no lesions  Cervix:  Normal appearing without discharge or lesions. Strings seen 3cm from os  Uterus:  Normal in size, shape and contour.  Mobile, nontender  Adnexa/parametria:     Rt: Normal in size, without masses or tenderness.   Lt: Normal in size, without masses or tenderness.  Anus and perineum: Normal   Patient informed chaperone available to be present for breast and pelvic exam. Patient has requested no chaperone to be present. Patient has been advised what will be completed during breast and pelvic exam.   Assessment/Plan:   1. Well woman exam with routine gynecological exam Pap due 2027  2. Screening for STDs (sexually transmitted diseases)  - SURESWAB CT/NG/T. vaginalis  3. IUD check up Reassured strings seen. Rutha Bouchard due to be changes 2028     Discussed SBE, colonoscopy and DEXA screening as directed/appropriate. Recommend of exercise weekly, including weight bearing exercise. Encouraged the use of seatbelts and sunscreen. Return in 1 year for annual or as needed.   Arlie Solomons B WHNP-BC 2:08 PM 12/28/2021

## 2021-12-29 LAB — SURESWAB CT/NG/T. VAGINALIS
C. trachomatis RNA, TMA: NOT DETECTED
N. gonorrhoeae RNA, TMA: NOT DETECTED
Trichomonas vaginalis RNA: NOT DETECTED

## 2022-01-04 ENCOUNTER — Ambulatory Visit
Admission: RE | Admit: 2022-01-04 | Discharge: 2022-01-04 | Disposition: A | Discharge status: Home / Self Care | Payer: 59 | Source: Ambulatory Visit | Attending: Radiology | Admitting: Radiology

## 2022-01-04 DIAGNOSIS — Z1231 Encounter for screening mammogram for malignant neoplasm of breast: Secondary | ICD-10-CM

## 2022-12-02 ENCOUNTER — Other Ambulatory Visit: Payer: Self-pay | Admitting: Radiology

## 2022-12-02 DIAGNOSIS — Z1231 Encounter for screening mammogram for malignant neoplasm of breast: Secondary | ICD-10-CM

## 2023-01-09 ENCOUNTER — Ambulatory Visit: Payer: 59 | Admitting: Radiology

## 2023-01-09 VITALS — BP 118/80 | HR 76

## 2023-01-09 DIAGNOSIS — N760 Acute vaginitis: Secondary | ICD-10-CM

## 2023-01-09 LAB — WET PREP FOR TRICH, YEAST, CLUE

## 2023-01-09 MED ORDER — FLUCONAZOLE 150 MG PO TABS: 150.0000 mg | ORAL_TABLET | ORAL | 0 refills | Status: AC

## 2023-01-09 NOTE — Progress Notes (Signed)
      Subjective: Vicki Contreras is a 42 y.o. female who complains of brown vaginal discharge after intercourse 1 month ago and second episode yesterday. Has Kyleena IUD.    Review of Systems  All other systems reviewed and are negative.   No past medical history on file.    Objective:  Today's Vitals   01/09/23 1420  BP: 118/80  Pulse: 76  SpO2: 99%   There is no height or weight on file to calculate BMI.   Physical Exam Vitals and nursing note reviewed. Exam conducted with a chaperone present.  Constitutional:      Appearance: Normal appearance. She is well-developed.  Pulmonary:     Effort: Pulmonary effort is normal.  Abdominal:     General: Abdomen is flat.     Palpations: Abdomen is soft.  Genitourinary:    General: Normal vulva.     Vagina: Vaginal discharge present. No erythema, bleeding or lesions.     Cervix: Normal. No discharge, friability, lesion or erythema.     Uterus: Normal.      Adnexa: Right adnexa normal and left adnexa normal.     Comments: IUD strings seen Neurological:     Mental Status: She is alert.  Psychiatric:        Mood and Affect: Mood normal.        Thought Content: Thought content normal.        Judgment: Judgment normal.    Microscopic wet-mount exam shows hyphae.   Raynelle Fanning, CMA present for exam  Assessment:/Plan:   1. Acute vaginitis (Primary) - fluconazole (DIFLUCAN) 150 MG tablet; Take 1 tablet (150 mg total) by mouth every 3 (three) days.  Dispense: 2 tablet; Refill: 0 - WET PREP FOR TRICH, YEAST, CLUE    Leverne Tessler B, NP 3:05 PM

## 2023-01-20 ENCOUNTER — Ambulatory Visit
Admission: RE | Admit: 2023-01-20 | Discharge: 2023-01-20 | Disposition: A | Discharge status: Home / Self Care | Payer: 59 | Source: Ambulatory Visit | Attending: Radiology | Admitting: Radiology

## 2023-01-20 ENCOUNTER — Ambulatory Visit: Payer: 59

## 2023-01-20 DIAGNOSIS — Z1231 Encounter for screening mammogram for malignant neoplasm of breast: Secondary | ICD-10-CM

## 2023-01-26 ENCOUNTER — Emergency Department (HOSPITAL_COMMUNITY)
Admission: EM | Admit: 2023-01-26 | Discharge: 2023-01-26 | Disposition: A | Discharge status: Home / Self Care | Payer: 59 | Attending: Emergency Medicine | Admitting: Emergency Medicine

## 2023-01-26 ENCOUNTER — Other Ambulatory Visit: Payer: Self-pay

## 2023-01-26 DIAGNOSIS — Y92410 Unspecified street and highway as the place of occurrence of the external cause: Secondary | ICD-10-CM | POA: Diagnosis not present

## 2023-01-26 DIAGNOSIS — M542 Cervicalgia: Secondary | ICD-10-CM | POA: Diagnosis present

## 2023-01-26 DIAGNOSIS — G44319 Acute post-traumatic headache, not intractable: Secondary | ICD-10-CM

## 2023-01-26 DIAGNOSIS — S161XXA Strain of muscle, fascia and tendon at neck level, initial encounter: Secondary | ICD-10-CM | POA: Insufficient documentation

## 2023-01-26 DIAGNOSIS — R519 Headache, unspecified: Secondary | ICD-10-CM | POA: Insufficient documentation

## 2023-01-26 MED ORDER — METHOCARBAMOL 500 MG PO TABS: 1000.0000 mg | ORAL_TABLET | Freq: Three times a day (TID) | ORAL | 0 refills | Status: DC | PRN

## 2023-01-26 MED ORDER — ACETAMINOPHEN 500 MG PO TABS
1000.0000 mg | ORAL_TABLET | Freq: Once | ORAL | Status: AC
  Administered 2023-01-26: 1000 mg via ORAL
  Filled 2023-01-26: qty 2
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
## 2023-01-31 ENCOUNTER — Ambulatory Visit: Payer: 59 | Admitting: Radiology

## 2023-02-07 ENCOUNTER — Ambulatory Visit (INDEPENDENT_AMBULATORY_CARE_PROVIDER_SITE_OTHER): Payer: 59 | Admitting: Radiology

## 2023-02-07 ENCOUNTER — Encounter: Payer: Self-pay | Admitting: Radiology

## 2023-02-07 VITALS — BP 124/78 | HR 78 | Ht 63.0 in | Wt 164.0 lb

## 2023-02-07 DIAGNOSIS — Z113 Encounter for screening for infections with a predominantly sexual mode of transmission: Secondary | ICD-10-CM

## 2023-02-07 DIAGNOSIS — Z01419 Encounter for gynecological examination (general) (routine) without abnormal findings: Secondary | ICD-10-CM

## 2023-02-07 DIAGNOSIS — Z9189 Other specified personal risk factors, not elsewhere classified: Secondary | ICD-10-CM | POA: Diagnosis not present

## 2023-02-07 NOTE — Patient Instructions (Signed)
 Preventive Care 58-44 Years Old, Female  Preventive care refers to lifestyle choices and visits with your health care provider that can promote health and wellness. Preventive care visits are also called wellness exams.  What can I expect for my preventive care visit?  Counseling  Your health care provider may ask you questions about your:  Medical history, including:  Past medical problems.  Family medical history.  Pregnancy history.  Current health, including:  Menstrual cycle.  Method of birth control.  Emotional well-being.  Home life and relationship well-being.  Sexual activity and sexual health.  Lifestyle, including:  Alcohol, nicotine or tobacco, and drug use.  Access to firearms.  Diet, exercise, and sleep habits.  Work and work Astronomer.  Sunscreen use.  Safety issues such as seatbelt and bike helmet use.  Physical exam  Your health care provider will check your:  Height and weight. These may be used to calculate your BMI (body mass index). BMI is a measurement that tells if you are at a healthy weight.  Waist circumference. This measures the distance around your waistline. This measurement also tells if you are at a healthy weight and may help predict your risk of certain diseases, such as type 2 diabetes and high blood pressure.  Heart rate and blood pressure.  Body temperature.  Skin for abnormal spots.  What immunizations do I need?    Vaccines are usually given at various ages, according to a schedule. Your health care provider will recommend vaccines for you based on your age, medical history, and lifestyle or other factors, such as travel or where you work.  What tests do I need?  Screening  Your health care provider may recommend screening tests for certain conditions. This may include:  Lipid and cholesterol levels.  Diabetes screening. This is done by checking your blood sugar (glucose) after you have not eaten for a while (fasting).  Pelvic exam and Pap test.  Hepatitis B test.  Hepatitis C  test.  HIV (human immunodeficiency virus) test.  STI (sexually transmitted infection) testing, if you are at risk.  Lung cancer screening.  Colorectal cancer screening.  Mammogram. Talk with your health care provider about when you should start having regular mammograms. This may depend on whether you have a family history of breast cancer.  BRCA-related cancer screening. This may be done if you have a family history of breast, ovarian, tubal, or peritoneal cancers.  Bone density scan. This is done to screen for osteoporosis.  Talk with your health care provider about your test results, treatment options, and if necessary, the need for more tests.  Follow these instructions at home:  Eating and drinking    Eat a diet that includes fresh fruits and vegetables, whole grains, lean protein, and low-fat dairy products.  Take vitamin and mineral supplements as recommended by your health care provider.  Do not drink alcohol if:  Your health care provider tells you not to drink.  You are pregnant, may be pregnant, or are planning to become pregnant.  If you drink alcohol:  Limit how much you have to 0-1 drink a day.  Know how much alcohol is in your drink. In the [place] one drink equals one 12 oz bottle of beer (355 mL), one 5 oz glass of wine (148 mL), or one 1 oz glass of hard liquor (44 mL).  Lifestyle  Brush your teeth every morning and night with fluoride toothpaste. Floss one time each day.  Exercise for at least  30 minutes 5 or more days each week.  Do not use any products that contain nicotine or tobacco. These products include cigarettes, chewing tobacco, and vaping devices, such as e-cigarettes. If you need help quitting, ask your health care provider.  Do not use drugs.  If you are sexually active, practice safe sex. Use a condom or other form of protection to prevent STIs.  If you do not wish to become pregnant, use a form of birth control. If you plan to become pregnant, see your health care provider for a  prepregnancy visit.  Take aspirin only as told by your health care provider. Make sure that you understand how much to take and what form to take. Work with your health care provider to find out whether it is safe and beneficial for you to take aspirin daily.  Find healthy ways to manage stress, such as:  Meditation, yoga, or listening to music.  Journaling.  Talking to a trusted person.  Spending time with friends and family.  Minimize exposure to UV radiation to reduce your risk of skin cancer.  Safety  Always wear your seat belt while driving or riding in a vehicle.  Do not drive:  If you have been drinking alcohol. Do not ride with someone who has been drinking.  When you are tired or distracted.  While texting.  If you have been using any mind-altering substances or drugs.  Wear a helmet and other protective equipment during sports activities.  If you have firearms in your house, make sure you follow all gun safety procedures.  Seek help if you have been physically or sexually abused.  What's next?  Visit your health care provider once a year for an annual wellness visit.  Ask your health care provider how often you should have your eyes and teeth checked.  Stay up to date on all vaccines.  This information is not intended to replace advice given to you by your health care provider. Make sure you discuss any questions you have with your health care provider.  Document Revised: 07/12/2020 Document Reviewed: 07/12/2020  Elsevier Patient Education  2024 [employer].

## 2023-02-07 NOTE — Progress Notes (Signed)
 Vicki Contreras 04-Dec-1980 [id number]   History:  43 y.o. G0 presents for annual exam. Would like STI screen, asymptomatic. No new partners. Happy with IUD. Does not have a PCP. Mammogram normal, dense breast tissue, interested in breast MRI. Mom and sister with breast cancer.  Gynecologic History Patient's last menstrual period was 01/29/2023 (approximate).   Contraception/Family planning: IUD Sexually active: yes Last Pap: 11/22. Results were: normal Last mammogram: 01/20/23. Results were: normal  Obstetric History OB History  Gravida Para Term Preterm AB Living  0 0 0 0 0 0  SAB IAB Ectopic Multiple Live Births  0 0 0 0 0    The following portions of the patient's history were reviewed and updated as appropriate: allergies, current medications, past family history, past medical history, past social history, past surgical history, and problem list.  Review of Systems  All other systems reviewed and are negative.   Past medical history, past surgical history, family history and social history were all reviewed and documented in the EPIC chart.  Exam:  Vitals:   02/07/23 1252  BP: 124/78  Pulse: 78  SpO2: 94%  Weight: 164 lb (74.4 kg)  Height: 5' 3 (1.6 m)   Body mass index is 29.05 kg/m.  Physical Exam Vitals and nursing note reviewed. Exam conducted with a chaperone present.  Constitutional:      Appearance: Normal appearance. She is normal weight.  HENT:     Head: Normocephalic and atraumatic.  Neck:     Thyroid : No thyroid  mass, thyromegaly or thyroid  tenderness.  Cardiovascular:     Rate and Rhythm: Regular rhythm.     Heart sounds: Normal heart sounds.  Pulmonary:     Effort: Pulmonary effort is normal.     Breath sounds: Normal breath sounds.  Chest:  Breasts:    Breasts are symmetrical.     Right: Normal. No inverted nipple, mass, nipple discharge, skin change or tenderness.     Left: Normal. No inverted nipple, mass, nipple discharge, skin  change or tenderness.  Abdominal:     General: Abdomen is flat. Bowel sounds are normal.     Palpations: Abdomen is soft.  Genitourinary:    General: Normal vulva.     Vagina: Normal. No vaginal discharge, bleeding or lesions.     Cervix: Normal. No discharge or lesion.     Uterus: Normal. Not enlarged and not tender.      Adnexa: Right adnexa normal and left adnexa normal.       Right: No mass, tenderness or fullness.         Left: No mass, tenderness or fullness.       Comments: IUD string seen Lymphadenopathy:     Upper Body:     Right upper body: No axillary adenopathy.     Left upper body: No axillary adenopathy.  Skin:    General: Skin is warm and dry.  Neurological:     Mental Status: She is alert and oriented to person, place, and time.  Psychiatric:        Mood and Affect: Mood normal.        Thought Content: Thought content normal.        Judgment: Judgment normal.      Darice Hoit, CMA present for exam  Assessment/Plan:   1. Well woman exam with routine gynecological exam (Primary) Pap 2027  2. At high risk for breast cancer Mom and sister with breast cancer Dense breast tissue - MR BREAST BILATERAL  W CONTRAST; Future   3. Screening for STDs (sexually transmitted diseases) - SURESWAB CT/NG/T. vaginalis - HIV antibody (with reflex) - RPR - Hepatitis C antibody   Discussed SBE, colonoscopy and DEXA screening as directed/appropriate. Recommend of exercise weekly, including weight bearing exercise. Encouraged the use of seatbelts and sunscreen.  No follow-ups on file.  GINETTE COZIER B WHNP-BC 1:14 PM 02/07/2023

## 2023-02-09 LAB — SURESWAB CT/NG/T. VAGINALIS
C. trachomatis RNA, TMA: NOT DETECTED
N. gonorrhoeae RNA, TMA: NOT DETECTED
Trichomonas vaginalis RNA: NOT DETECTED

## 2023-02-10 LAB — HIV ANTIBODY (ROUTINE TESTING W REFLEX): HIV 1&2 Ab, 4th Generation: NONREACTIVE

## 2023-02-10 LAB — HEPATITIS C ANTIBODY: Hepatitis C Ab: NONREACTIVE

## 2023-02-10 LAB — RPR: RPR Ser Ql: NONREACTIVE

## 2023-03-15 ENCOUNTER — Ambulatory Visit
Admission: RE | Admit: 2023-03-15 | Discharge: 2023-03-15 | Disposition: A | Discharge status: Home / Self Care | Payer: 59 | Source: Ambulatory Visit | Attending: Radiology | Admitting: Radiology

## 2023-03-15 DIAGNOSIS — Z9189 Other specified personal risk factors, not elsewhere classified: Secondary | ICD-10-CM

## 2023-03-15 MED ORDER — GADOPICLENOL 0.5 MMOL/ML IV SOLN
7.0000 mL | Freq: Once | INTRAVENOUS | Status: AC | PRN
  Administered 2023-03-15: 7 mL via INTRAVENOUS

## 2023-03-19 ENCOUNTER — Other Ambulatory Visit: Payer: Self-pay

## 2023-03-19 ENCOUNTER — Encounter: Payer: Self-pay | Admitting: *Deleted

## 2023-03-19 ENCOUNTER — Ambulatory Visit
Admission: EM | Admit: 2023-03-19 | Discharge: 2023-03-19 | Disposition: A | Discharge status: Home / Self Care | Payer: 59 | Attending: Physician Assistant | Admitting: Physician Assistant

## 2023-03-19 DIAGNOSIS — J111 Influenza due to unidentified influenza virus with other respiratory manifestations: Secondary | ICD-10-CM | POA: Diagnosis not present

## 2023-03-19 LAB — POC COVID19/FLU A&B COMBO
Covid Antigen, POC: NEGATIVE
Influenza A Antigen, POC: POSITIVE — AB
Influenza B Antigen, POC: POSITIVE — AB

## 2023-03-19 MED ORDER — OSELTAMIVIR PHOSPHATE 75 MG PO CAPS: 75.0000 mg | ORAL_CAPSULE | Freq: Two times a day (BID) | ORAL | 0 refills | Status: AC

## 2023-03-19 NOTE — ED Triage Notes (Signed)
 Body aches, congestion, headache, cough, subjective fever x 2 days. No meds today

## 2023-03-19 NOTE — ED Provider Notes (Signed)
 [facility]    CSN: [id number] Arrival date & time: 03/19/23  0959      History   Chief Complaint Chief Complaint  Patient presents with   Generalized Body Aches    HPI Vicki Contreras is a 43 y.o. female.   Patient here today for evaluation of generalized bodyaches, congestion, cough that started 2 days ago.  She has not had any vomiting or diarrhea.  She has taken TheraFlu with mild relief.  The history is provided by the patient.    History reviewed. No pertinent past medical history.  There are no active problems to display for this patient.   Past Surgical History:  Procedure Laterality Date   oral procedure      OB History     Gravida  0   Para  0   Term  0   Preterm  0   AB  0   Living  0      SAB  0   IAB  0   Ectopic  0   Multiple  0   Live Births  0            Home Medications    Prior to Admission medications   Medication Sig Start Date End Date Taking? Authorizing Provider  Aspirin-Acetaminophen-Caffeine (GOODY HEADACHE PO) Take 1 packet by mouth 2 (two) times daily as needed (pain/headache/cramps). For headache    Yes [provider]  levonorgestrel (KYLEENA) 19.5 MG IUD 1 each by Intrauterine route once. Inserted 10/16/21   Yes [provider]  oseltamivir (TAMIFLU) 75 MG capsule Take 1 capsule (75 mg total) by mouth every 12 (twelve) hours. 03/19/23  Yes Tomi Bamberger, PA-C  VITAMIN D PO Take by mouth.   Yes [provider]  fluconazole (DIFLUCAN) 150 MG tablet Take 1 tablet (150 mg total) by mouth every 3 (three) days. Patient not taking: Reported on 02/07/2023 01/09/23   Chrzanowski, Clearnce Hasten B, NP  methocarbamol (ROBAXIN) 500 MG tablet Take 2 tablets (1,000 mg total) by mouth every 8 (eight) hours as needed for muscle spasms. Patient not taking: Reported on 02/07/2023 01/26/23   Renne Crigler, PA-C    Family History Family History  Problem Relation Age of Onset   Breast cancer  Mother 59   Breast cancer Maternal Grandmother        over 64   Breast cancer Maternal Aunt        age 16   Breast cancer Other        MGG aunts    Social History Social History   Tobacco Use   Smoking status: Never    Passive exposure: Never   Smokeless tobacco: Never  Vaping Use   Vaping status: Never Used  Substance Use Topics   Alcohol use: Yes    Comment: rare   Drug use: No     Allergies   Patient has no known allergies.   Review of Systems Review of Systems  Constitutional:  Positive for chills and fever.  HENT:  Positive for congestion and sore throat. Negative for ear pain.   Eyes:  Negative for discharge and redness.  Respiratory:  Positive for cough. Negative for shortness of breath and wheezing.   Gastrointestinal:  Negative for abdominal pain, diarrhea, nausea and vomiting.     Physical Exam Triage Vital Signs ED Triage Vitals [03/19/23 1030]  Encounter Vitals Group     BP      Systolic BP Percentile  Diastolic BP Percentile      Pulse      Resp      Temp      Temp src      SpO2      Weight      Height      Head Circumference      Peak Flow      Pain Score 7     Pain Loc      Pain Education      Exclude from Growth Chart    No data found.  Updated Vital Signs There were no vitals taken for this visit.  Visual Acuity Right Eye Distance:   Left Eye Distance:   Bilateral Distance:    Right Eye Near:   Left Eye Near:    Bilateral Near:     Physical Exam Vitals and nursing note reviewed.  Constitutional:      General: She is not in acute distress.    Appearance: Normal appearance. She is not ill-appearing.  HENT:     Head: Normocephalic and atraumatic.     Nose: Congestion present.     Mouth/Throat:     Mouth: Mucous membranes are moist.     Pharynx: No oropharyngeal exudate or posterior oropharyngeal erythema.  Eyes:     Conjunctiva/sclera: Conjunctivae normal.  Cardiovascular:     Rate and Rhythm: Normal rate and  regular rhythm.     Heart sounds: Normal heart sounds. No murmur heard. Pulmonary:     Effort: Pulmonary effort is normal. No respiratory distress.     Breath sounds: Normal breath sounds. No wheezing, rhonchi or rales.  Skin:    General: Skin is warm and dry.  Neurological:     Mental Status: She is alert.  Psychiatric:        Mood and Affect: Mood normal.        Thought Content: Thought content normal.      UC Treatments / Results  Labs (all labs ordered are listed, but only abnormal results are displayed) Labs Reviewed  POC COVID19/FLU A&B COMBO - Abnormal; Notable for the following components:      Result Value   Influenza A Antigen, POC Positive (*)    Influenza B Antigen, POC Positive (*)    All other components within normal limits    EKG   Radiology No results found.  Procedures Procedures (including critical care time)  Medications Ordered in UC Medications - No data to display  Initial Impression / Assessment and Plan / UC Course  I have reviewed the triage vital signs and the nursing notes.  Pertinent labs & imaging results that were available during my care of the patient were reviewed by me and considered in my medical decision making (see chart for details).    Flu screening positive.  Will treat with Tamiflu and advised symptomatic treatment, increase fluids and rest.  Encouraged follow-up if no gradual improvement or with any worsening symptoms.  Final Clinical Impressions(s) / UC Diagnoses   Final diagnoses:  Influenza   Discharge Instructions   None    ED Prescriptions     Medication Sig Dispense Auth. Provider   oseltamivir (TAMIFLU) 75 MG capsule Take 1 capsule (75 mg total) by mouth every 12 (twelve) hours. 10 capsule Tomi Bamberger, PA-C      PDMP not reviewed this encounter.   Tomi Bamberger, PA-C 03/19/23 1109

## 2023-06-05 ENCOUNTER — Emergency Department (HOSPITAL_COMMUNITY)
Admission: EM | Admit: 2023-06-05 | Discharge: 2023-06-06 | Disposition: A | Discharge status: Home / Self Care | Attending: Emergency Medicine | Admitting: Emergency Medicine

## 2023-06-05 ENCOUNTER — Other Ambulatory Visit: Payer: Self-pay

## 2023-06-05 ENCOUNTER — Encounter (HOSPITAL_COMMUNITY): Payer: Self-pay

## 2023-06-05 DIAGNOSIS — M542 Cervicalgia: Secondary | ICD-10-CM | POA: Insufficient documentation

## 2023-06-05 DIAGNOSIS — R519 Headache, unspecified: Secondary | ICD-10-CM | POA: Insufficient documentation

## 2023-06-05 DIAGNOSIS — Y92410 Unspecified street and highway as the place of occurrence of the external cause: Secondary | ICD-10-CM | POA: Diagnosis not present
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
## 2023-06-06 MED ORDER — METHOCARBAMOL 500 MG PO TABS: 1000.0000 mg | ORAL_TABLET | Freq: Three times a day (TID) | ORAL | 0 refills | Status: AC | PRN

## 2023-06-06 MED ORDER — ACETAMINOPHEN 500 MG PO TABS
1000.0000 mg | ORAL_TABLET | Freq: Once | ORAL | Status: AC
  Administered 2023-06-06: 1000 mg via ORAL
  Filled 2023-06-06: qty 2
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
## 2023-06-06 NOTE — ED Notes (Signed)
 Patient ambulated to bathroom without assistance and without difficulty.
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
## 2023-12-28 ENCOUNTER — Other Ambulatory Visit: Payer: Self-pay

## 2023-12-28 ENCOUNTER — Ambulatory Visit
Admission: RE | Admit: 2023-12-28 | Discharge: 2023-12-28 | Disposition: A | Discharge status: Home / Self Care | Attending: Physician Assistant

## 2023-12-28 VITALS — BP 117/82 | HR 97 | Temp 98.6°F | Resp 17 | Ht 63.0 in | Wt 158.0 lb

## 2023-12-28 DIAGNOSIS — R0989 Other specified symptoms and signs involving the circulatory and respiratory systems: Secondary | ICD-10-CM | POA: Diagnosis not present

## 2023-12-28 DIAGNOSIS — J101 Influenza due to other identified influenza virus with other respiratory manifestations: Secondary | ICD-10-CM

## 2023-12-28 LAB — POC COVID19/FLU A&B COMBO
Covid Antigen, POC: NEGATIVE
Influenza A Antigen, POC: NEGATIVE
Influenza B Antigen, POC: POSITIVE — AB

## 2023-12-28 MED ORDER — BENZONATATE 100 MG PO CAPS: 100.0000 mg | ORAL_CAPSULE | Freq: Three times a day (TID) | ORAL | 0 refills | Status: AC

## 2023-12-28 NOTE — ED Triage Notes (Addendum)
 Pt presents with complaints of productive cough, sore throat, and generalized body aches. Symptoms initially started on Thursday 11/27 but became worse last night. Currently rates overall pain a 9/10. OTC Theraflu, Advil  cold & cough, and throat gargles taken at home with some improvement/relief. Had a fever on Friday morning. Went to a basketball game on Wednesday, feels like she picked up illness from there.

## 2023-12-28 NOTE — Discharge Instructions (Addendum)
 VISIT SUMMARY:  You came in today with body aches, cough, and flu-like symptoms that started around Thanksgiving. You have experienced severe coughing, sore throat, headache, body aches, eye discomfort, fever, chills, nasal congestion, and a runny nose. You also mentioned shortness of breath and slight wheezing. Your fever and chills have resolved, and you have been using over-the-counter medications for relief.  YOUR PLAN:  -INFLUENZA B INFECTION: You have been diagnosed with Influenza B, a viral infection that causes symptoms like body aches, cough, fever, chills, nasal congestion, and shortness of breath. Since your symptoms have lasted more than 48 hours, you are outside the window for antiviral treatment like Tamiflu . You should continue using over-the-counter medications such as Theraflu, Alka Seltzer, or similar products as directed on the packaging, typically every six hours. You have also been prescribed Tessalon Perles to help manage your cough. If needed, you can take ibuprofen  or Aleve for additional relief. Please monitor for any warning signs such as persistent fever, significant breathing difficulties, chest pain, neck stiffness, confusion, or fainting.  INSTRUCTIONS:  Please follow the medication instructions provided and monitor for any warning signs. If you experience persistent fever, significant breathing difficulties, chest pain, neck stiffness, confusion, or fainting, seek medical attention immediately.

## 2023-12-28 NOTE — ED Provider Notes (Signed)
 [facility]    CSN: [id number] Arrival date & time: 12/28/23  9057      History   Chief Complaint Chief Complaint  Patient presents with   Cough    A bad cold.i think with a brutal cough - Entered by patient   Generalized Body Aches    HPI Vicki Contreras is a 43 y.o. female.  has no past medical history on file.   HPI  Discussed the use of AI scribe software for clinical note transcription with the patient, who gave verbal consent to proceed.  The patient presents with body aches, cough, and flu-like symptoms.  She has been experiencing body aches, cough, and flu-like symptoms since Thanksgiving, approximately three to four days ago. The cough is severe, causing her to 'use the bathroom every time I cough' and 'screaming.' Additional symptoms include sore throat, headache, body aches, and eye discomfort. She confirms having a fever and chills, which have since resolved. Significant nasal congestion and a runny nose are also present.  She notes shortness of breath and slight wheezing. No nausea, vomiting, diarrhea, or known rashes. She mentions attending a basketball game recently but is uncertain if this is related to her symptoms.  For symptom relief, she has taken Theraflu, Advil  Cold and Cough, and Betadine throat gargles. She took Theraflu two to three times in one day but has not continued it daily. No history of asthma or breathing troubles.   History reviewed. No pertinent past medical history.  There are no active problems to display for this patient.   Past Surgical History:  Procedure Laterality Date   oral procedure      OB History     Gravida  0   Para  0   Term  0   Preterm  0   AB  0   Living  0      SAB  0   IAB  0   Ectopic  0   Multiple  0   Live Births  0            Home Medications    Prior to Admission medications   Medication Sig Start Date End Date Taking? Authorizing Provider  benzonatate  (TESSALON )  100 MG capsule Take 1 capsule (100 mg total) by mouth every 8 (eight) hours. 12/28/23  Yes Adael Culbreath E, PA-C  Aspirin-Acetaminophen -Caffeine (GOODY HEADACHE PO) Take 1 packet by mouth 2 (two) times daily as needed (pain/headache/cramps). For headache     [provider]  fluconazole  (DIFLUCAN ) 150 MG tablet Take 1 tablet (150 mg total) by mouth every 3 (three) days. Patient not taking: Reported on 02/07/2023 01/09/23   Chrzanowski, Jami B, NP  levonorgestrel  (KYLEENA ) 19.5 MG IUD 1 each by Intrauterine route once. Inserted 10/16/21    [provider]  methocarbamol  (ROBAXIN ) 500 MG tablet Take 2 tablets (1,000 mg total) by mouth every 8 (eight) hours as needed for muscle spasms. 06/06/23   Donnajean Lynwood DEL, PA-C  oseltamivir  (TAMIFLU ) 75 MG capsule Take 1 capsule (75 mg total) by mouth every 12 (twelve) hours. 03/19/23   Billy Asberry FALCON, PA-C  VITAMIN D PO Take by mouth.    [provider]    Family History Family History  Problem Relation Age of Onset   Breast cancer Mother 40   Breast cancer Maternal Grandmother        over 24   Breast cancer Maternal Aunt        age 47  Breast cancer Other        MGG aunts    Social History Social History   Tobacco Use   Smoking status: Never    Passive exposure: Never   Smokeless tobacco: Never  Vaping Use   Vaping status: Never Used  Substance Use Topics   Alcohol use: Yes    Comment: rare   Drug use: No     Allergies   Patient has no known allergies.   Review of Systems Review of Systems  Constitutional:  Positive for chills, fatigue and fever.  HENT:  Positive for congestion, rhinorrhea and sore throat.   Respiratory:  Positive for cough, shortness of breath and wheezing.   Gastrointestinal:  Negative for diarrhea, nausea and vomiting.  Musculoskeletal:  Positive for myalgias.  Skin:  Negative for rash.  Neurological:  Positive for headaches.     Physical Exam Triage Vital Signs ED Triage  Vitals  Encounter Vitals Group     BP 12/28/23 1032 117/82     Girls Systolic BP Percentile --      Girls Diastolic BP Percentile --      Boys Systolic BP Percentile --      Boys Diastolic BP Percentile --      Pulse Rate 12/28/23 1032 97     Resp 12/28/23 1032 17     Temp 12/28/23 1032 98.6 F (37 C)     Temp Source 12/28/23 1032 Oral     SpO2 12/28/23 1032 97 %     Weight 12/28/23 1031 158 lb (71.7 kg)     Height 12/28/23 1031 5' 3 (1.6 m)     Head Circumference --      Peak Flow --      Pain Score 12/28/23 1031 9     Pain Loc --      Pain Education --      Exclude from Growth Chart --    No data found.  Updated Vital Signs BP 117/82 (BP Location: Right Arm)   Pulse 97   Temp 98.6 F (37 C) (Oral)   Resp 17   Ht 5' 3 (1.6 m)   Wt 158 lb (71.7 kg)   SpO2 97%   BMI 27.99 kg/m   Visual Acuity Right Eye Distance:   Left Eye Distance:   Bilateral Distance:    Right Eye Near:   Left Eye Near:    Bilateral Near:     Physical Exam Vitals reviewed.  Constitutional:      General: She is awake. She is not in acute distress.    Appearance: Normal appearance. She is well-developed and well-groomed. She is ill-appearing. She is not toxic-appearing or diaphoretic.     Comments: Patient is mildly ill-appearing but seated comfortably in exam chair.  No evidence of acute distress.  She is repeatedly coughing especially during pulmonary exam  HENT:     Head: Normocephalic and atraumatic.     Right Ear: Hearing, tympanic membrane and ear canal normal.     Left Ear: Hearing, tympanic membrane and ear canal normal.     Mouth/Throat:     Lips: Pink.     Mouth: Mucous membranes are moist.     Pharynx: Oropharynx is clear. Uvula midline. No pharyngeal swelling, oropharyngeal exudate, posterior oropharyngeal erythema, uvula swelling or postnasal drip.  Cardiovascular:     Rate and Rhythm: Normal rate and regular rhythm.     Pulses: Normal pulses.  Radial pulses are 2+  on the right side and 2+ on the left side.     Heart sounds: Normal heart sounds. No murmur heard.    No friction rub. No gallop.  Pulmonary:     Effort: Pulmonary effort is normal.     Breath sounds: Normal breath sounds. No decreased air movement. No decreased breath sounds, wheezing, rhonchi or rales.  Musculoskeletal:     Cervical back: Normal range of motion and neck supple.  Lymphadenopathy:     Head:     Right side of head: No submental, submandibular or preauricular adenopathy.     Left side of head: No submental, submandibular or preauricular adenopathy.     Cervical:     Right cervical: No superficial cervical adenopathy.    Left cervical: No superficial cervical adenopathy.     Upper Body:     Right upper body: No supraclavicular adenopathy.     Left upper body: No supraclavicular adenopathy.  Skin:    General: Skin is warm and dry.  Neurological:     General: No focal deficit present.     Mental Status: She is alert and oriented to person, place, and time.  Psychiatric:        Mood and Affect: Mood normal.        Behavior: Behavior normal. Behavior is cooperative.        Thought Content: Thought content normal.        Judgment: Judgment normal.      UC Treatments / Results  Labs (all labs ordered are listed, but only abnormal results are displayed) Labs Reviewed  POC COVID19/FLU A&B COMBO - Abnormal; Notable for the following components:      Result Value   Influenza B Antigen, POC Positive (*)    All other components within normal limits    EKG   Radiology No results found.  Procedures Procedures (including critical care time)  Medications Ordered in UC Medications - No data to display  Initial Impression / Assessment and Plan / UC Course  I have reviewed the triage vital signs and the nursing notes.  Pertinent labs & imaging results that were available during my care of the patient were reviewed by me and considered in my medical decision making  (see chart for details).      Final Clinical Impressions(s) / UC Diagnoses   Final diagnoses:  Symptoms of upper respiratory infection (URI)  Influenza B   Influenza B infection Confirmed by testing. Symptoms include body aches, cough, fever, chills, nasal congestion, runny nose, and shortness of breath. Symptoms have persisted for over 48 hours, making her outside the window for Tamiflu  treatment. No asthma or breathing troubles in the past. No nausea, vomiting, diarrhea, or rashes. A little wheezing noted at home. Fever and chills persist. 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 Vitals and physical exam are largely reassuring. - Recommended over-the-counter medications such as Theraflu, Alka Seltzer, or similar products, following manufacturer's instructions, typically every six hours. - Prescribed Tessalon  Perles for cough management. - Advised to supplement with ibuprofen  or Aleve if needed. - Instructed to monitor for warning signs such as persistent fever, significant breathing difficulties, chest pain, neck stiffness, confusion, or syncope.    Discharge Instructions      VISIT SUMMARY:  You came in today with body aches, cough, and flu-like symptoms that started around Thanksgiving. You have experienced severe coughing, sore throat, headache, body aches, eye discomfort, fever, chills, nasal  congestion, and a runny nose. You also mentioned shortness of breath and slight wheezing. Your fever and chills have resolved, and you have been using over-the-counter medications for relief.  YOUR PLAN:  -INFLUENZA B INFECTION: You have been diagnosed with Influenza B, a viral infection that causes symptoms like body aches, cough, fever, chills, nasal congestion, and shortness of breath. Since your symptoms have lasted more than 48 hours, you are outside the window for antiviral treatment like Tamiflu . You should continue using over-the-counter medications such as Theraflu, Alka  Seltzer, or similar products as directed on the packaging, typically every six hours. You have also been prescribed Tessalon Perles to help manage your cough. If needed, you can take ibuprofen  or Aleve for additional relief. Please monitor for any warning signs such as persistent fever, significant breathing difficulties, chest pain, neck stiffness, confusion, or fainting.  INSTRUCTIONS:  Please follow the medication instructions provided and monitor for any warning signs. If you experience persistent fever, significant breathing difficulties, chest pain, neck stiffness, confusion, or fainting, seek medical attention immediately.     ED Prescriptions     Medication Sig Dispense Auth. Provider   benzonatate (TESSALON) 100 MG capsule Take 1 capsule (100 mg total) by mouth every 8 (eight) hours. 21 capsule Sydelle Sherfield E, PA-C      PDMP not reviewed this encounter.   Marylene Rocky BRAVO, PA-C 12/28/23 1146

## 2024-02-04 ENCOUNTER — Other Ambulatory Visit: Payer: Self-pay | Admitting: Radiology

## 2024-02-04 DIAGNOSIS — Z1231 Encounter for screening mammogram for malignant neoplasm of breast: Secondary | ICD-10-CM

## 2024-02-13 ENCOUNTER — Ambulatory Visit: Admitting: Radiology

## 2024-02-20 ENCOUNTER — Ambulatory Visit
Admission: RE | Admit: 2024-02-20 | Discharge: 2024-02-20 | Disposition: A | Source: Ambulatory Visit | Attending: Radiology | Admitting: Radiology

## 2024-02-20 DIAGNOSIS — Z1231 Encounter for screening mammogram for malignant neoplasm of breast: Secondary | ICD-10-CM

## 2024-02-27 ENCOUNTER — Encounter: Payer: Self-pay | Admitting: Radiology

## 2024-02-27 ENCOUNTER — Ambulatory Visit: Admitting: Radiology

## 2024-02-27 ENCOUNTER — Other Ambulatory Visit (HOSPITAL_COMMUNITY)
Admission: RE | Admit: 2024-02-27 | Discharge: 2024-02-27 | Disposition: A | Source: Ambulatory Visit | Attending: Radiology | Admitting: Radiology

## 2024-02-27 VITALS — BP 118/74 | HR 72 | Ht 63.0 in | Wt 171.0 lb

## 2024-02-27 DIAGNOSIS — Z1331 Encounter for screening for depression: Secondary | ICD-10-CM

## 2024-02-27 DIAGNOSIS — Z01419 Encounter for gynecological examination (general) (routine) without abnormal findings: Secondary | ICD-10-CM | POA: Insufficient documentation

## 2024-02-27 DIAGNOSIS — Z975 Presence of (intrauterine) contraceptive device: Secondary | ICD-10-CM

## 2024-02-27 NOTE — Progress Notes (Signed)
 "  Vicki Contreras 1980/02/17 [id number]   History:  44 y.o. G0 presents for annual exam.No gyn concerns.  Gynecologic History No LMP recorded. (Menstrual status: IUD). Period Cycle (Days):  (irregular light periods with Kyleena ) Contraception/Family planning: IUD Last Pap: 11/22. Results were: normal Last mammogram: 02/20/24. Results were: normal  Obstetric History OB History  Gravida Para Term Preterm AB Living  0 0 0 0 0 0  SAB IAB Ectopic Multiple Live Births  0 0 0 0 0       02/27/2024    1:22 PM 02/07/2023   12:54 PM  Depression screen PHQ 2/9  Decreased Interest 0 0  Down, Depressed, Hopeless 0 0  PHQ - 2 Score 0 0     The following portions of the patient's history were reviewed and updated as appropriate: allergies, current medications, past family history, past medical history, past social history, past surgical history, and problem list.  Review of Systems  All other systems reviewed and are negative.   Past medical history, past surgical history, family history and social history were all reviewed and documented in the EPIC chart.  Exam:  Vitals:   02/27/24 1320  BP: 118/74  Pulse: 72  SpO2: 99%  Weight: 171 lb (77.6 kg)  Height: 5' 3 (1.6 m)   Body mass index is 30.29 kg/m.  Physical Exam Vitals and nursing note reviewed. Exam conducted with a chaperone present.  Constitutional:      Appearance: Normal appearance. She is normal weight.  HENT:     Head: Normocephalic and atraumatic.  Neck:     Thyroid : No thyroid  mass, thyromegaly or thyroid  tenderness.  Cardiovascular:     Rate and Rhythm: Regular rhythm.     Heart sounds: Normal heart sounds.  Pulmonary:     Effort: Pulmonary effort is normal.     Breath sounds: Normal breath sounds.  Chest:  Breasts:    Breasts are symmetrical.     Right: Normal. No inverted nipple, mass, nipple discharge, skin change or tenderness.     Left: Normal. No inverted nipple, mass, nipple discharge, skin  change or tenderness.  Abdominal:     General: Abdomen is flat. Bowel sounds are normal.     Palpations: Abdomen is soft.  Genitourinary:    General: Normal vulva.     Vagina: Normal. No vaginal discharge, bleeding or lesions.     Cervix: Normal. No discharge or lesion.     Uterus: Normal. Not enlarged and not tender.      Adnexa: Right adnexa normal and left adnexa normal.       Right: No mass, tenderness or fullness.         Left: No mass, tenderness or fullness.       Comments: IUD strings seen Lymphadenopathy:     Upper Body:     Right upper body: No axillary adenopathy.     Left upper body: No axillary adenopathy.  Skin:    General: Skin is warm and dry.  Neurological:     Mental Status: She is alert and oriented to person, place, and time.  Psychiatric:        Mood and Affect: Mood normal.        Thought Content: Thought content normal.        Judgment: Judgment normal.      Darice Hoit, CMA present for exam  Assessment/Plan:   1. Well woman exam with routine gynecological exam (Primary) - Cytology - PAP( [place])  2. IUD (intrauterine device) in place Kyleena  may remain until 2028  3. Depression screen negative    Return in about 1 year (around 02/26/2025) for Annual.  Dacen Frayre B WHNP-BC 2:00 PM 02/27/2024 "

## 2024-03-02 ENCOUNTER — Ambulatory Visit: Payer: Self-pay | Admitting: Radiology

## 2024-03-02 LAB — CYTOLOGY - PAP
Comment: NEGATIVE
Diagnosis: NEGATIVE
High risk HPV: NEGATIVE
